# Patient Record
Sex: Male | Born: 1950 | Race: White | Hispanic: No | Marital: Married | Smoking: Former smoker
Health system: Southern US, Community
[De-identification: ages and names within clinical notes are randomized; demographics above are authoritative.]

## PROBLEM LIST (undated history)

## (undated) ENCOUNTER — Ambulatory Visit (HOSPITAL_BASED_OUTPATIENT_CLINIC_OR_DEPARTMENT_OTHER)

## (undated) ENCOUNTER — Ambulatory Visit (HOSPITAL_BASED_OUTPATIENT_CLINIC_OR_DEPARTMENT_OTHER): Source: Home / Self Care

## (undated) DIAGNOSIS — J984 Other disorders of lung: Secondary | ICD-10-CM

## (undated) DIAGNOSIS — M1990 Unspecified osteoarthritis, unspecified site: Secondary | ICD-10-CM

## (undated) DIAGNOSIS — K760 Fatty (change of) liver, not elsewhere classified: Secondary | ICD-10-CM

## (undated) DIAGNOSIS — R918 Other nonspecific abnormal finding of lung field: Secondary | ICD-10-CM

## (undated) DIAGNOSIS — J439 Emphysema, unspecified: Secondary | ICD-10-CM

## (undated) DIAGNOSIS — I288 Other diseases of pulmonary vessels: Secondary | ICD-10-CM

## (undated) DIAGNOSIS — E785 Hyperlipidemia, unspecified: Secondary | ICD-10-CM

## (undated) DIAGNOSIS — M25561 Pain in right knee: Secondary | ICD-10-CM

## (undated) DIAGNOSIS — M1711 Unilateral primary osteoarthritis, right knee: Secondary | ICD-10-CM

## (undated) DIAGNOSIS — I2584 Coronary atherosclerosis due to calcified coronary lesion: Secondary | ICD-10-CM

## (undated) DIAGNOSIS — E7800 Pure hypercholesterolemia, unspecified: Secondary | ICD-10-CM

## (undated) DIAGNOSIS — I2510 Atherosclerotic heart disease of native coronary artery without angina pectoris: Secondary | ICD-10-CM

## (undated) HISTORY — DX: Fatty (change of) liver, not elsewhere classified: K76.0

## (undated) HISTORY — PX: EYE SURGERY: SHX253

## (undated) HISTORY — DX: Emphysema, unspecified: J43.9

## (undated) HISTORY — DX: Pain in right knee: M25.561

## (undated) HISTORY — DX: Coronary atherosclerosis due to calcified coronary lesion: I25.84

## (undated) HISTORY — DX: Other nonspecific abnormal finding of lung field: R91.8

## (undated) HISTORY — DX: Atherosclerotic heart disease of native coronary artery without angina pectoris: I25.10

## (undated) HISTORY — PX: COLONOSCOPY: SHX174

## (undated) HISTORY — DX: Hyperlipidemia, unspecified: E78.5

## (undated) HISTORY — DX: Other disorders of lung: J98.4

## (undated) HISTORY — DX: Unilateral primary osteoarthritis, right knee: M17.11

## (undated) HISTORY — DX: Other diseases of pulmonary vessels: I28.8

---

## 2014-05-23 DIAGNOSIS — M25561 Pain in right knee: Secondary | ICD-10-CM | POA: Insufficient documentation

## 2014-05-23 DIAGNOSIS — M1711 Unilateral primary osteoarthritis, right knee: Secondary | ICD-10-CM | POA: Insufficient documentation

## 2014-05-23 HISTORY — DX: Unilateral primary osteoarthritis, right knee: M17.11

## 2014-05-23 HISTORY — DX: Pain in right knee: M25.561

## 2015-08-15 ENCOUNTER — Other Ambulatory Visit: Payer: Self-pay | Admitting: Orthopedic Surgery

## 2015-09-04 ENCOUNTER — Ambulatory Visit (HOSPITAL_COMMUNITY)
Admission: RE | Admit: 2015-09-04 | Discharge: 2015-09-04 | Disposition: A | Discharge status: Home / Self Care | Payer: 59 | Source: Ambulatory Visit | Attending: Orthopedic Surgery | Admitting: Orthopedic Surgery

## 2015-09-04 ENCOUNTER — Encounter (HOSPITAL_COMMUNITY): Payer: Self-pay

## 2015-09-04 ENCOUNTER — Encounter (HOSPITAL_COMMUNITY)
Admission: RE | Admit: 2015-09-04 | Discharge: 2015-09-04 | Disposition: A | Discharge status: Home / Self Care | Payer: 59 | Source: Ambulatory Visit | Attending: Orthopedic Surgery | Admitting: Orthopedic Surgery

## 2015-09-04 DIAGNOSIS — Z01812 Encounter for preprocedural laboratory examination: Secondary | ICD-10-CM | POA: Diagnosis not present

## 2015-09-04 DIAGNOSIS — R918 Other nonspecific abnormal finding of lung field: Secondary | ICD-10-CM | POA: Diagnosis not present

## 2015-09-04 DIAGNOSIS — Z01818 Encounter for other preprocedural examination: Secondary | ICD-10-CM

## 2015-09-04 HISTORY — DX: Pure hypercholesterolemia, unspecified: E78.00

## 2015-09-04 HISTORY — DX: Unspecified osteoarthritis, unspecified site: M19.90

## 2015-09-04 LAB — COMPREHENSIVE METABOLIC PANEL
ALBUMIN: 3.9 g/dL (ref 3.5–5.0)
ALT: 61 U/L (ref 17–63)
ANION GAP: 9 (ref 5–15)
AST: 75 U/L — ABNORMAL HIGH (ref 15–41)
Alkaline Phosphatase: 75 U/L (ref 38–126)
BILIRUBIN TOTAL: 0.9 mg/dL (ref 0.3–1.2)
BUN: 16 mg/dL (ref 6–20)
CO2: 24 mmol/L (ref 22–32)
Calcium: 9.6 mg/dL (ref 8.9–10.3)
Chloride: 106 mmol/L (ref 101–111)
Creatinine, Ser: 0.75 mg/dL (ref 0.61–1.24)
GFR calc Af Amer: >60 mL/min (ref >60)
GFR calc non Af Amer: >60 mL/min (ref >60)
GLUCOSE: 93 mg/dL (ref 65–99)
POTASSIUM: 4.4 mmol/L (ref 3.5–5.1)
SODIUM: 139 mmol/L (ref 135–145)
TOTAL PROTEIN: 7.3 g/dL (ref 6.5–8.1)

## 2015-09-04 LAB — CBC WITH DIFFERENTIAL/PLATELET
BASOS PCT: 1 %
Basophils Absolute: 0.1 10*3/uL (ref 0.0–0.1)
EOS ABS: 0.3 10*3/uL (ref 0.0–0.7)
Eosinophils Relative: 5 %
HCT: 41.8 % (ref 39.0–52.0)
Hemoglobin: 14.3 g/dL (ref 13.0–17.0)
Lymphocytes Relative: 41 %
Lymphs Abs: 2.1 10*3/uL (ref 0.7–4.0)
MCH: 33.9 pg (ref 26.0–34.0)
MCHC: 34.2 g/dL (ref 30.0–36.0)
MCV: 99.1 fL (ref 78.0–100.0)
MONO ABS: 0.6 10*3/uL (ref 0.1–1.0)
MONOS PCT: 12 %
Neutro Abs: 2.1 10*3/uL (ref 1.7–7.7)
Neutrophils Relative %: 41 %
Platelets: 228 10*3/uL (ref 150–400)
RBC: 4.22 MIL/uL (ref 4.22–5.81)
RDW: 12.7 % (ref 11.5–15.5)
WBC: 5.1 10*3/uL (ref 4.0–10.5)

## 2015-09-04 LAB — URINE MICROSCOPIC-ADD ON
Bacteria, UA: NONE SEEN
RBC / HPF: NONE SEEN RBC/hpf (ref 0–5)

## 2015-09-04 LAB — URINALYSIS, ROUTINE W REFLEX MICROSCOPIC
BILIRUBIN URINE: NEGATIVE
Glucose, UA: NEGATIVE mg/dL
Hgb urine dipstick: NEGATIVE
KETONES UR: NEGATIVE mg/dL
NITRITE: NEGATIVE
PROTEIN: NEGATIVE mg/dL
SPECIFIC GRAVITY, URINE: 1.022 (ref 1.005–1.030)
pH: 5.5 (ref 5.0–8.0)

## 2015-09-04 LAB — PROTIME-INR
INR: 0.97 (ref 0.00–1.49)
Prothrombin Time: 13.1 seconds (ref 11.6–15.2)

## 2015-09-04 LAB — APTT: aPTT: 28 seconds (ref 24–37)

## 2015-09-04 LAB — SURGICAL PCR SCREEN
MRSA, PCR: NEGATIVE
STAPHYLOCOCCUS AUREUS: NEGATIVE

## 2015-09-04 NOTE — Progress Notes (Signed)
   09/04/15 1007  OBSTRUCTIVE SLEEP APNEA  Have you ever been diagnosed with sleep apnea through a sleep study? No  Do you snore loudly (loud enough to be heard through closed doors)?  1  Do you often feel tired, fatigued, or sleepy during the daytime (such as falling asleep during driving or talking to someone)? 1 (MORE SO IN LAST YR....)  Has anyone observed you stop breathing during your sleep? 0  Do you have, or are you being treated for high blood pressure? 0  BMI more than 35 kg/m2? 0  Age > 50 (1-yes) 1  Neck circumference greater than:Male 16 inches or larger, Male 17inches or larger? 1  Male Gender (Yes=1) 1  Obstructive Sleep Apnea Score 5  Score 5 or greater  Results sent to PCP

## 2015-09-04 NOTE — Progress Notes (Signed)
Takes ibuprofen and was taking Lipitor. Was instructed by PCP to stop taking lipitor, may have elevated liver enzymes. Denies any heart issues.  Had stress test about 15 yrs ago just to get a baseline for physical. Normal results.  Had EKG, ? PFT at PCP back in Nov. 2016 as part of medical clearance for this surgery.

## 2015-09-04 NOTE — Pre-Procedure Instructions (Signed)
    Casey Webster  09/04/2015     No Pharmacies Listed   Your procedure is scheduled on Thursday, Dec. 22nd   Report to [facility] Admitting at 5:30 AM  Call this number if you have problems the morning of surgery:  [phone]   Remember:  Do not eat food or drink liquids after midnight Wednesday.   Take these medicines the morning of surgery with A SIP OF WATER :   Do not wear jewelry - no rings or watches.  Do not wear lotions or colognes.  You may NOT wear deodorant the day of surgery.             Men may shave face and neck.   Do not bring valuables to the hospital.  [facility] is not responsible for any belongings or valuables.  Contacts, dentures or bridgework may not be worn into surgery.  Leave your suitcase in the car.  After surgery it may be brought to your room. For patients admitted to the hospital, discharge time will be determined by your treatment team.  Name and phone number of your driver:   NANCY  Talmadge  - SPOUSE   Please read over the following fact sheets that you were given. Pain Booklet, Coughing and Deep Breathing, Blood Transfusion Information, MRSA Information and Surgical Site Infection Prevention

## 2015-09-05 LAB — URINE CULTURE

## 2015-09-13 MED ORDER — SODIUM CHLORIDE 0.9 % IV SOLN: INTRAVENOUS | Status: DC

## 2015-09-13 MED ORDER — CHLORHEXIDINE GLUCONATE 4 % EX LIQD: 60.0000 mL | Freq: Once | CUTANEOUS | Status: DC

## 2015-09-13 MED ORDER — DEXTROSE 5 % IV SOLN
3.0000 g | INTRAVENOUS | Status: AC
  Administered 2015-09-14: 3 g via INTRAVENOUS
  Filled 2015-09-13: qty 3000

## 2015-09-13 MED ORDER — TRANEXAMIC ACID 1000 MG/10ML IV SOLN
1000.0000 mg | INTRAVENOUS | Status: AC
  Administered 2015-09-14: 1000 mg via INTRAVENOUS
  Filled 2015-09-13 (×2): qty 10

## 2015-09-13 MED ORDER — BUPIVACAINE LIPOSOME 1.3 % IJ SUSP
20.0000 mL | Freq: Once | INTRAMUSCULAR | Status: AC
  Administered 2015-09-14: 20 mL
  Filled 2015-09-13 (×2): qty 20

## 2015-09-14 ENCOUNTER — Inpatient Hospital Stay (HOSPITAL_COMMUNITY): Payer: 59 | Admitting: Anesthesiology

## 2015-09-14 ENCOUNTER — Encounter (HOSPITAL_COMMUNITY)
Admission: RE | Disposition: A | Discharge status: Home Health | Payer: Self-pay | Source: Ambulatory Visit | Attending: Orthopedic Surgery

## 2015-09-14 ENCOUNTER — Inpatient Hospital Stay (HOSPITAL_COMMUNITY)
Admission: RE | Admit: 2015-09-14 | Discharge: 2015-09-15 | DRG: 470 | Disposition: A | Discharge status: Home Health | Payer: 59 | Source: Ambulatory Visit | Attending: Orthopedic Surgery | Admitting: Orthopedic Surgery

## 2015-09-14 ENCOUNTER — Encounter (HOSPITAL_COMMUNITY): Payer: Self-pay

## 2015-09-14 DIAGNOSIS — Z79899 Other long term (current) drug therapy: Secondary | ICD-10-CM | POA: Diagnosis not present

## 2015-09-14 DIAGNOSIS — M1711 Unilateral primary osteoarthritis, right knee: Secondary | ICD-10-CM | POA: Diagnosis present

## 2015-09-14 DIAGNOSIS — D62 Acute posthemorrhagic anemia: Secondary | ICD-10-CM | POA: Diagnosis not present

## 2015-09-14 DIAGNOSIS — Z96659 Presence of unspecified artificial knee joint: Secondary | ICD-10-CM

## 2015-09-14 DIAGNOSIS — Z87891 Personal history of nicotine dependence: Secondary | ICD-10-CM

## 2015-09-14 HISTORY — PX: TOTAL KNEE ARTHROPLASTY: SHX125

## 2015-09-14 LAB — CBC
HEMATOCRIT: 37.6 % — AB (ref 39.0–52.0)
HEMOGLOBIN: 13.1 g/dL (ref 13.0–17.0)
MCH: 34.2 pg — ABNORMAL HIGH (ref 26.0–34.0)
MCHC: 34.8 g/dL (ref 30.0–36.0)
MCV: 98.2 fL (ref 78.0–100.0)
Platelets: 196 10*3/uL (ref 150–400)
RBC: 3.83 MIL/uL — ABNORMAL LOW (ref 4.22–5.81)
RDW: 12.9 % (ref 11.5–15.5)
WBC: 9.5 10*3/uL (ref 4.0–10.5)

## 2015-09-14 LAB — CREATININE, SERUM: Creatinine, Ser: 0.79 mg/dL (ref 0.61–1.24)

## 2015-09-14 SURGERY — ARTHROPLASTY, KNEE, TOTAL
Anesthesia: Spinal | Site: Knee | Laterality: Right

## 2015-09-14 MED ORDER — ALUM & MAG HYDROXIDE-SIMETH 200-200-20 MG/5ML PO SUSP: 30.0000 mL | ORAL | Status: DC | PRN

## 2015-09-14 MED ORDER — FLEET ENEMA 7-19 GM/118ML RE ENEM: 1.0000 | ENEMA | Freq: Once | RECTAL | Status: DC | PRN

## 2015-09-14 MED ORDER — SODIUM CHLORIDE 0.9 % IJ SOLN
INTRAMUSCULAR | Status: DC | PRN
  Administered 2015-09-14: 10 mL

## 2015-09-14 MED ORDER — MEPERIDINE HCL 25 MG/ML IJ SOLN: 6.2500 mg | INTRAMUSCULAR | Status: DC | PRN

## 2015-09-14 MED ORDER — ZOLPIDEM TARTRATE 5 MG PO TABS: 5.0000 mg | ORAL_TABLET | Freq: Every evening | ORAL | Status: DC | PRN

## 2015-09-14 MED ORDER — METOCLOPRAMIDE HCL 5 MG/ML IJ SOLN: 5.0000 mg | Freq: Three times a day (TID) | INTRAMUSCULAR | Status: DC | PRN

## 2015-09-14 MED ORDER — METOCLOPRAMIDE HCL 5 MG PO TABS: 5.0000 mg | ORAL_TABLET | Freq: Three times a day (TID) | ORAL | Status: DC | PRN

## 2015-09-14 MED ORDER — ONDANSETRON HCL 4 MG/2ML IJ SOLN
INTRAMUSCULAR | Status: DC | PRN
  Administered 2015-09-14: 4 mg via INTRAVENOUS

## 2015-09-14 MED ORDER — ONDANSETRON HCL 4 MG PO TABS: 4.0000 mg | ORAL_TABLET | Freq: Four times a day (QID) | ORAL | Status: DC | PRN

## 2015-09-14 MED ORDER — SODIUM CHLORIDE 0.9 % IR SOLN
Status: DC | PRN
  Administered 2015-09-14: 1000 mL

## 2015-09-14 MED ORDER — SUCCINYLCHOLINE CHLORIDE 20 MG/ML IJ SOLN
INTRAMUSCULAR | Status: AC
  Filled 2015-09-14: qty 1

## 2015-09-14 MED ORDER — OXYCODONE HCL 5 MG PO TABS
ORAL_TABLET | ORAL | Status: AC
  Filled 2015-09-14: qty 2

## 2015-09-14 MED ORDER — PROPOFOL 500 MG/50ML IV EMUL
INTRAVENOUS | Status: DC | PRN
  Administered 2015-09-14: 50 ug/kg/min via INTRAVENOUS

## 2015-09-14 MED ORDER — FENTANYL CITRATE (PF) 250 MCG/5ML IJ SOLN
INTRAMUSCULAR | Status: AC
  Filled 2015-09-14: qty 5

## 2015-09-14 MED ORDER — ATORVASTATIN CALCIUM 20 MG PO TABS
20.0000 mg | ORAL_TABLET | Freq: Every day | ORAL | Status: DC
  Administered 2015-09-14 – 2015-09-15 (×2): 20 mg via ORAL
  Filled 2015-09-14 (×2): qty 1

## 2015-09-14 MED ORDER — ACETAMINOPHEN 650 MG RE SUPP: 650.0000 mg | Freq: Four times a day (QID) | RECTAL | Status: DC | PRN

## 2015-09-14 MED ORDER — HYDROMORPHONE HCL 1 MG/ML IJ SOLN
INTRAMUSCULAR | Status: AC
  Administered 2015-09-14: 0.5 mg via INTRAVENOUS
  Filled 2015-09-14: qty 1

## 2015-09-14 MED ORDER — BISACODYL 5 MG PO TBEC: 5.0000 mg | DELAYED_RELEASE_TABLET | Freq: Every day | ORAL | Status: DC | PRN

## 2015-09-14 MED ORDER — ONDANSETRON HCL 4 MG/2ML IJ SOLN: 4.0000 mg | Freq: Once | INTRAMUSCULAR | Status: DC | PRN

## 2015-09-14 MED ORDER — ENOXAPARIN SODIUM 30 MG/0.3ML ~~LOC~~ SOLN
30.0000 mg | Freq: Two times a day (BID) | SUBCUTANEOUS | Status: DC
Start: 2015-09-15 — End: 2015-09-15
  Administered 2015-09-15: 30 mg via SUBCUTANEOUS
  Filled 2015-09-14: qty 0.3

## 2015-09-14 MED ORDER — FLUTICASONE PROPIONATE 50 MCG/ACT NA SUSP: 1.0000 | Freq: Every day | NASAL | Status: DC | PRN

## 2015-09-14 MED ORDER — LACTATED RINGERS IV SOLN
INTRAVENOUS | Status: DC | PRN
  Administered 2015-09-14 (×2): via INTRAVENOUS

## 2015-09-14 MED ORDER — ONDANSETRON HCL 4 MG/2ML IJ SOLN
INTRAMUSCULAR | Status: AC
  Filled 2015-09-14: qty 2

## 2015-09-14 MED ORDER — CELECOXIB 200 MG PO CAPS
200.0000 mg | ORAL_CAPSULE | Freq: Two times a day (BID) | ORAL | Status: DC
  Administered 2015-09-14 – 2015-09-15 (×3): 200 mg via ORAL
  Filled 2015-09-14 (×3): qty 1

## 2015-09-14 MED ORDER — OXYCODONE HCL 5 MG PO TABS
5.0000 mg | ORAL_TABLET | ORAL | Status: DC | PRN
  Administered 2015-09-14 – 2015-09-15 (×5): 10 mg via ORAL
  Filled 2015-09-14 (×4): qty 2

## 2015-09-14 MED ORDER — BUPIVACAINE-EPINEPHRINE (PF) 0.5% -1:200000 IJ SOLN
INTRAMUSCULAR | Status: AC
  Filled 2015-09-14: qty 1.8

## 2015-09-14 MED ORDER — BUPIVACAINE-EPINEPHRINE (PF) 0.5% -1:200000 IJ SOLN
INTRAMUSCULAR | Status: AC
  Filled 2015-09-14: qty 30

## 2015-09-14 MED ORDER — PHENOL 1.4 % MT LIQD: 1.0000 | OROMUCOSAL | Status: DC | PRN

## 2015-09-14 MED ORDER — OXYCODONE HCL ER 10 MG PO T12A
10.0000 mg | EXTENDED_RELEASE_TABLET | Freq: Two times a day (BID) | ORAL | Status: DC
  Administered 2015-09-14 – 2015-09-15 (×3): 10 mg via ORAL
  Filled 2015-09-14 (×4): qty 1

## 2015-09-14 MED ORDER — HYDROMORPHONE HCL 1 MG/ML IJ SOLN
0.2500 mg | INTRAMUSCULAR | Status: DC | PRN
  Administered 2015-09-14 (×4): 0.5 mg via INTRAVENOUS

## 2015-09-14 MED ORDER — HYDROMORPHONE HCL 1 MG/ML IJ SOLN
1.0000 mg | INTRAMUSCULAR | Status: DC | PRN
  Administered 2015-09-14 – 2015-09-15 (×3): 1 mg via INTRAVENOUS
  Filled 2015-09-14 (×3): qty 1

## 2015-09-14 MED ORDER — FENTANYL CITRATE (PF) 100 MCG/2ML IJ SOLN
INTRAMUSCULAR | Status: DC | PRN
  Administered 2015-09-14: 50 ug via INTRAVENOUS
  Administered 2015-09-14: 25 ug via INTRAVENOUS

## 2015-09-14 MED ORDER — METHOCARBAMOL 1000 MG/10ML IJ SOLN
500.0000 mg | Freq: Four times a day (QID) | INTRAVENOUS | Status: DC | PRN
  Filled 2015-09-14: qty 5

## 2015-09-14 MED ORDER — ONDANSETRON HCL 4 MG/2ML IJ SOLN: 4.0000 mg | Freq: Four times a day (QID) | INTRAMUSCULAR | Status: DC | PRN

## 2015-09-14 MED ORDER — BUPIVACAINE-EPINEPHRINE (PF) 0.25% -1:200000 IJ SOLN
INTRAMUSCULAR | Status: DC | PRN
  Administered 2015-09-14: 30 mL

## 2015-09-14 MED ORDER — SODIUM CHLORIDE 0.9 % IV SOLN: INTRAVENOUS | Status: DC

## 2015-09-14 MED ORDER — TRANEXAMIC ACID 1000 MG/10ML IV SOLN: 1000.0000 mg | Freq: Once | INTRAVENOUS | Status: DC

## 2015-09-14 MED ORDER — DOCUSATE SODIUM 100 MG PO CAPS
100.0000 mg | ORAL_CAPSULE | Freq: Two times a day (BID) | ORAL | Status: DC
  Administered 2015-09-14 (×2): 100 mg via ORAL
  Filled 2015-09-14 (×3): qty 1

## 2015-09-14 MED ORDER — BUPIVACAINE-EPINEPHRINE (PF) 0.25% -1:200000 IJ SOLN
INTRAMUSCULAR | Status: AC
  Filled 2015-09-14: qty 30

## 2015-09-14 MED ORDER — DIPHENHYDRAMINE HCL 12.5 MG/5ML PO ELIX: 12.5000 mg | ORAL_SOLUTION | ORAL | Status: DC | PRN

## 2015-09-14 MED ORDER — MENTHOL 3 MG MT LOZG: 1.0000 | LOZENGE | OROMUCOSAL | Status: DC | PRN

## 2015-09-14 MED ORDER — MIDAZOLAM HCL 2 MG/2ML IJ SOLN
INTRAMUSCULAR | Status: AC
  Filled 2015-09-14: qty 2

## 2015-09-14 MED ORDER — CEFAZOLIN SODIUM-DEXTROSE 2-3 GM-% IV SOLR
2.0000 g | Freq: Four times a day (QID) | INTRAVENOUS | Status: AC
  Administered 2015-09-14 (×2): 2 g via INTRAVENOUS
  Filled 2015-09-14 (×2): qty 50

## 2015-09-14 MED ORDER — PROPOFOL 10 MG/ML IV BOLUS
INTRAVENOUS | Status: AC
  Filled 2015-09-14: qty 20

## 2015-09-14 MED ORDER — METHOCARBAMOL 500 MG PO TABS
ORAL_TABLET | ORAL | Status: AC
  Filled 2015-09-14: qty 1

## 2015-09-14 MED ORDER — METHOCARBAMOL 500 MG PO TABS
500.0000 mg | ORAL_TABLET | Freq: Four times a day (QID) | ORAL | Status: DC | PRN
  Administered 2015-09-14 – 2015-09-15 (×3): 500 mg via ORAL
  Filled 2015-09-14 (×2): qty 1

## 2015-09-14 MED ORDER — ACETAMINOPHEN 325 MG PO TABS: 650.0000 mg | ORAL_TABLET | Freq: Four times a day (QID) | ORAL | Status: DC | PRN

## 2015-09-14 MED ORDER — MIDAZOLAM HCL 5 MG/5ML IJ SOLN
INTRAMUSCULAR | Status: DC | PRN
  Administered 2015-09-14: 2 mg via INTRAVENOUS

## 2015-09-14 MED ORDER — SENNOSIDES-DOCUSATE SODIUM 8.6-50 MG PO TABS: 1.0000 | ORAL_TABLET | Freq: Every evening | ORAL | Status: DC | PRN

## 2015-09-14 SURGICAL SUPPLY — 59 items
BANDAGE ESMARK 6X9 LF (GAUZE/BANDAGES/DRESSINGS) ×1 IMPLANT
BLADE SAGITTAL 13X1.27X60 (BLADE) ×2 IMPLANT
BLADE SAW SGTL 83.5X18.5 (BLADE) ×4 IMPLANT
BLADE SURG 10 STRL SS (BLADE) ×4 IMPLANT
BNDG ESMARK 6X9 LF (GAUZE/BANDAGES/DRESSINGS) ×2
BOWL SMART MIX CTS (DISPOSABLE) ×2 IMPLANT
CAPT KNEE TOTAL 3 ×2 IMPLANT
CEMENT BONE SIMPLEX SPEEDSET (Cement) ×4 IMPLANT
COVER SURGICAL LIGHT HANDLE (MISCELLANEOUS) ×2 IMPLANT
CUFF TOURNIQUET SINGLE 34IN LL (TOURNIQUET CUFF) ×2 IMPLANT
DRAPE EXTREMITY T 121X128X90 (DRAPE) ×2 IMPLANT
DRAPE INCISE IOBAN 66X45 STRL (DRAPES) ×4 IMPLANT
DRAPE PROXIMA HALF (DRAPES) IMPLANT
DRAPE U-SHAPE 47X51 STRL (DRAPES) ×2 IMPLANT
DRSG ADAPTIC 3X8 NADH LF (GAUZE/BANDAGES/DRESSINGS) ×2 IMPLANT
DRSG PAD ABDOMINAL 8X10 ST (GAUZE/BANDAGES/DRESSINGS) ×2 IMPLANT
DURAPREP 26ML APPLICATOR (WOUND CARE) ×4 IMPLANT
ELECT REM PT RETURN 9FT ADLT (ELECTROSURGICAL) ×2
ELECTRODE REM PT RTRN 9FT ADLT (ELECTROSURGICAL) ×1 IMPLANT
GAUZE SPONGE 4X4 12PLY STRL (GAUZE/BANDAGES/DRESSINGS) ×2 IMPLANT
GLOVE BIOGEL M 7.0 STRL (GLOVE) IMPLANT
GLOVE BIOGEL PI IND STRL 7.0 (GLOVE) ×1 IMPLANT
GLOVE BIOGEL PI IND STRL 7.5 (GLOVE) IMPLANT
GLOVE BIOGEL PI IND STRL 8.5 (GLOVE) ×2 IMPLANT
GLOVE BIOGEL PI INDICATOR 7.0 (GLOVE) ×1
GLOVE BIOGEL PI INDICATOR 7.5 (GLOVE)
GLOVE BIOGEL PI INDICATOR 8.5 (GLOVE) ×2
GLOVE SURG ORTHO 8.0 STRL STRW (GLOVE) ×4 IMPLANT
GLOVE SURG SS PI 7.0 STRL IVOR (GLOVE) ×2 IMPLANT
GOWN STRL REUS W/ TWL LRG LVL3 (GOWN DISPOSABLE) ×1 IMPLANT
GOWN STRL REUS W/ TWL XL LVL3 (GOWN DISPOSABLE) ×2 IMPLANT
GOWN STRL REUS W/TWL LRG LVL3 (GOWN DISPOSABLE) ×1
GOWN STRL REUS W/TWL XL LVL3 (GOWN DISPOSABLE) ×2
HANDPIECE INTERPULSE COAX TIP (DISPOSABLE) ×1
HOOD PEEL AWAY FACE SHEILD DIS (HOOD) ×6 IMPLANT
KIT BASIN OR (CUSTOM PROCEDURE TRAY) ×2 IMPLANT
KIT ROOM TURNOVER OR (KITS) ×2 IMPLANT
KNEE CAPITATED TOTAL 3 ×1 IMPLANT
MANIFOLD NEPTUNE II (INSTRUMENTS) ×2 IMPLANT
NEEDLE 22X1 1/2 (OR ONLY) (NEEDLE) ×4 IMPLANT
NS IRRIG 1000ML POUR BTL (IV SOLUTION) ×2 IMPLANT
PACK TOTAL JOINT (CUSTOM PROCEDURE TRAY) ×2 IMPLANT
PACK UNIVERSAL I (CUSTOM PROCEDURE TRAY) ×2 IMPLANT
PAD ARMBOARD 7.5X6 YLW CONV (MISCELLANEOUS) ×4 IMPLANT
PADDING CAST COTTON 6X4 STRL (CAST SUPPLIES) ×2 IMPLANT
SET HNDPC FAN SPRY TIP SCT (DISPOSABLE) ×1 IMPLANT
SPONGE GAUZE 4X4 12PLY STER LF (GAUZE/BANDAGES/DRESSINGS) ×2 IMPLANT
STAPLER VISISTAT 35W (STAPLE) IMPLANT
SUCTION FRAZIER TIP 10 FR DISP (SUCTIONS) ×2 IMPLANT
SUT BONE WAX W31G (SUTURE) ×2 IMPLANT
SUT VIC AB 0 CTB1 27 (SUTURE) ×4 IMPLANT
SUT VIC AB 1 CT1 27 (SUTURE) ×3
SUT VIC AB 1 CT1 27XBRD ANBCTR (SUTURE) ×3 IMPLANT
SUT VIC AB 2-0 CT1 27 (SUTURE) ×2
SUT VIC AB 2-0 CT1 TAPERPNT 27 (SUTURE) ×2 IMPLANT
SYR 20CC LL (SYRINGE) ×4 IMPLANT
TOWEL OR 17X24 6PK STRL BLUE (TOWEL DISPOSABLE) ×2 IMPLANT
TOWEL OR 17X26 10 PK STRL BLUE (TOWEL DISPOSABLE) IMPLANT
WATER STERILE IRR 1000ML POUR (IV SOLUTION) ×4 IMPLANT

## 2015-09-14 NOTE — Anesthesia Procedure Notes (Signed)
Spinal Patient location during procedure: OR Start time: 09/14/2015 7:30 AM End time: 09/14/2015 7:34 AM Staffing Anesthesiologist: Lillia Abed Performed by: anesthesiologist  Preanesthetic Checklist Completed: patient identified, site marked, surgical consent, pre-op evaluation, timeout performed, IV checked, risks and benefits discussed and monitors and equipment checked Spinal Block Patient position: sitting Prep: Betadine Patient monitoring: heart rate, cardiac monitor, continuous pulse ox and blood pressure Approach: right paramedian Location: L3-4 Injection technique: single-shot Needle Needle type: Pencan  Needle gauge: 24 G Needle length: 9 cm Needle insertion depth: 8 cm

## 2015-09-14 NOTE — Progress Notes (Signed)
Report given to Jamie RN

## 2015-09-14 NOTE — Evaluation (Signed)
Physical Therapy Evaluation Patient Details Name: Casey Webster MRN: [id number] DOB: [birth year] Today's Date: 09/14/2015   History of Present Illness  elective TKR  Clinical Impression  Pt is s/p TKA resulting in the deficits listed below (see PT Problem List).  Pt will benefit from continued PT to increase their independence and safety with mobility to allow discharge to home.      Follow Up Recommendations Home health PT;Supervision - Intermittent    Equipment Recommendations  None recommended by PT    Recommendations for Other Services       Precautions / Restrictions Precautions Precautions: Knee Restrictions Weight Bearing Restrictions: Yes RUE Weight Bearing: Weight bearing as tolerated      Mobility  Bed Mobility Overal bed mobility: Needs Assistance Bed Mobility: Supine to Sit     Supine to sit: Min assist     General bed mobility comments: assistance to guide RLE and education on sequencing  Transfers Overall transfer level: Needs assistance Equipment used: Rolling walker (2 wheeled) Transfers: Sit to/from Stand Sit to Stand: Min assist         General transfer comment: education on sequencing  Ambulation/Gait Ambulation/Gait assistance: Min assist Ambulation Distance (Feet): 10 Feet Assistive device: Rolling walker (2 wheeled) Gait Pattern/deviations: Step-to pattern;Decreased stance time - right;Antalgic Gait velocity: decreased Gait velocity interpretation: Below normal speed for age/gender General Gait Details: min assist for balance, education on sequencing, proper technique  Stairs            Wheelchair Mobility    Modified Rankin (Stroke Patients Only)       Balance Overall balance assessment: No apparent balance deficits (not formally assessed)                                           Pertinent Vitals/Pain Pain Assessment: 0-10 Pain Score: 4  Pain Descriptors / Indicators: Aching Pain Intervention(s):  Limited activity within patient's tolerance;Monitored during session;Premedicated before session    Home Living Family/patient expects to be discharged to:: Private residence Living Arrangements: Spouse/significant other Available Help at Discharge: Family;Available 24 hours/day Type of Home: House Home Access: Stairs to enter Entrance Stairs-Rails: None Entrance Stairs-Number of Steps: 1 Home Layout: Two level;Able to live on main level with bedroom/bathroom (created room on main level temporarily) Home Equipment: Walker - 2 wheels;Crutches      Prior Function Level of Independence: Independent               Hand Dominance        Extremity/Trunk Assessment   Upper Extremity Assessment: Overall WFL for tasks assessed           Lower Extremity Assessment: RLE deficits/detail         Communication   Communication: No difficulties  Cognition Arousal/Alertness: Awake/alert Behavior During Therapy: WFL for tasks assessed/performed Overall Cognitive Status: Within Functional Limits for tasks assessed                      General Comments      Exercises Total Joint Exercises Ankle Circles/Pumps: AROM;Both;10 reps;Seated Quad Sets: AROM;Strengthening;Both;10 reps;Seated Gluteal Sets: AROM;Both;10 reps;Seated      Assessment/Plan    PT Assessment Patient needs continued PT services  PT Diagnosis Difficulty walking   PT Problem List Decreased range of motion;Decreased activity tolerance;Decreased mobility;Decreased knowledge of use of DME;Decreased knowledge of precautions;Pain  PT Treatment Interventions  DME instruction;Gait training;Stair training;Functional mobility training;Therapeutic activities;Therapeutic exercise;Patient/family education   PT Goals (Current goals can be found in the Care Plan section) Acute Rehab PT Goals Patient Stated Goal: "catch my wife"  PT Goal Formulation: With patient/family Time For Goal Achievement:  09/21/15 Potential to Achieve Goals: Good    Frequency 7X/week   Barriers to discharge        Co-evaluation               End of Session Equipment Utilized During Treatment: Gait belt Activity Tolerance: Patient tolerated treatment well;No increased pain Patient left: in chair;with family/visitor present;with call bell/phone within reach           Time: 1459-1524 PT Time Calculation (min) (ACUTE ONLY): 25 min   Charges:   PT Evaluation $Initial PT Evaluation Tier I: 1 Procedure PT Treatments $Therapeutic Exercise: 8-22 mins   PT G Codes:        Shanna Cisco 09/14/2015, 3:34 PM  09/14/2015 [place], [place]

## 2015-09-14 NOTE — Op Note (Signed)
TOTAL KNEE REPLACEMENT OPERATIVE NOTE:  09/14/2015  9:35 AM  PATIENT:  Casey Webster  64 y.o. male  PRE-OPERATIVE DIAGNOSIS:  primary osteoarthritis right knee  POST-OPERATIVE DIAGNOSIS:  primary osteoarthritis right knee  PROCEDURE:  Procedure(s): TOTAL KNEE ARTHROPLASTY  SURGEON:  Surgeon(s): Vickey Huger, MD  PHYSICIAN ASSISTANT: Carlynn Spry, [facility]  ANESTHESIA:   spinal  DRAINS: Hemovac  SPECIMEN: None  COUNTS:  Correct  TOURNIQUET:  * Missing tourniquet times found for documented tourniquets in log:  [id number] *  DICTATION:  Indication for procedure:    The patient is a 64 y.o. male who has failed conservative treatment for primary osteoarthritis right knee.  Informed consent was obtained prior to anesthesia. The risks versus benefits of the operation were explain and in a way the patient can, and did, understand.   On the implant demand matching protocol, this patient scored 10.  Therefore, this patient did" "did not receive a polyethylene insert with vitamin E which is a high demand implant.  Description of procedure:     The patient was taken to the operating room and placed under anesthesia.  The patient was positioned in the usual fashion taking care that all body parts were adequately padded and/or protected.  I foley catheter was not placed.  A tourniquet was applied and the leg prepped and draped in the usual sterile fashion.  The extremity was exsanguinated with the esmarch and tourniquet inflated to 350 mmHg.  Pre-operative range of motion was normal.  The knee was in 5 degree of mild valgus.  A midline incision approximately 6-7 inches long was made with a #10 blade.  A new blade was used to make a parapatellar arthrotomy going 2-3 cm into the quadriceps tendon, over the patella, and alongside the medial aspect of the patellar tendon.  A synovectomy was then performed with the #10 blade and forceps. I then elevated the deep MCL off the medial tibial metaphysis  subperiosteally around to the semimembranosus attachment.    I everted the patella and used calipers to measure patellar thickness.  I used the reamer to ream down to appropriate thickness to recreate the native thickness.  I then removed excess bone with the rongeur and sagittal saw.  I used the appropriately sized template and drilled the three lug holes.  I then put the trial in place and measured the thickness with the calipers to ensure recreation of the native thickness.  The trial was then removed and the patella subluxed and the knee brought into flexion.  A homan retractor was place to retract and protect the patella and lateral structures.  A Z-retractor was place medially to protect the medial structures.  The extra-medullary alignment system was used to make cut the tibial articular surface perpendicular to the anamotic axis of the tibia and in 3 degrees of posterior slope.  The cut surface and alignment jig was removed.  I then used the intramedullary alignment guide to make a 4 valgus cut on the distal femur.  I then marked out the epicondylar axis on the distal femur.  The posterior condylar axis measured 5 degrees.  I then used the anterior referencing sizer and measured the femur to be a size 10.  The 4-In-1 cutting block was screwed into place in external rotation matching the posterior condylar angle, making our cuts perpendicular to the epicondylar axis.  Anterior, posterior and chamfer cuts were made with the sagittal saw.  The cutting block and cut pieces were removed.  A lamina  spreader was placed in 90 degrees of flexion.  The ACL, PCL, menisci, and posterior condylar osteophytes were removed.  A 10 mm spacer blocked was found to offer good flexion and extension gap balance after moderate in degree releasing.   The scoop retractor was then placed and the femoral finishing block was pinned in place.  The small sagittal saw was used as well as the lug drill to finish the femur.  The  block and cut surfaces were removed and the medullary canal hole filled with autograft bone from the cut pieces.  The tibia was delivered forward in deep flexion and external rotation.  A size G tray was selected and pinned into place centered on the medial 1/3 of the tibial tubercle.  The reamer and keel was used to prepare the tibia through the tray.    I then trialed with the size 10 femur, size G tibia, a 10 mm insert and the 35 patella.  I had excellent flexion/extension gap balance, excellent patella tracking.  Flexion was full and beyond 120 degrees; extension was zero.  These components were chosen and the staff opened them to me on the back table while the knee was lavaged copiously and the cement mixed.  The soft tissue was infiltrated with 60cc of exparel 1.3% through a 21 gauge needle.  I cemented in the components and removed all excess cement.  The polyethylene tibial component was snapped into place and the knee placed in extension while cement was hardening.  The capsule was infilltrated with 30cc of .25% Marcaine with epinephrine.  A hemovac was place in the joint exiting superolaterally.  A pain pump was place superomedially superficial to the arthrotomy.  Once the cement was hard, the tourniquet was let down.  Hemostasis was obtained.  The arthrotomy was closed with figure-8 #1 vicryl sutures.  The deep soft tissues were closed with #0 vicryls and the subcuticular layer closed with a running #2-0 vicryl.  The skin was reapproximated and closed with skin staples.  The wound was dressed with xeroform, 4 x4's, 2 ABD sponges, a single layer of webril and a TED stocking.   The patient was then awakened, extubated, and taken to the recovery room in stable condition.  BLOOD LOSS:  300cc DRAINS: 1 hemovac, 1 pain catheter COMPLICATIONS:  None.  PLAN OF CARE: Admit to inpatient   PATIENT DISPOSITION:  PACU - hemodynamically stable.   Delay start of Pharmacological VTE agent (>24hrs) due  to surgical blood loss or risk of bleeding:  not applicable  Please fax a copy of this op note to my office at [phone] (please only include page 1 and 2 of the Case Information op note)

## 2015-09-14 NOTE — Anesthesia Postprocedure Evaluation (Signed)
Anesthesia Post Note  Patient: Casey Webster  Procedure(s) Performed: Procedure(s) (LRB): TOTAL KNEE ARTHROPLASTY (Right)  Patient location during evaluation: PACU Anesthesia Type: General and Spinal Level of consciousness: awake and alert Pain management: pain level controlled Vital Signs Assessment: post-procedure vital signs reviewed and stable Respiratory status: spontaneous breathing, nonlabored ventilation, respiratory function stable and patient connected to nasal cannula oxygen Cardiovascular status: blood pressure returned to baseline and stable Postop Assessment: no signs of nausea or vomiting Anesthetic complications: no    Last Vitals:  Filed Vitals:   09/14/15 1200 09/14/15 1238  BP:  135/75  Pulse: 52   Temp:  36.6 C  Resp: 17 16    Last Pain:  Filed Vitals:   09/14/15 1418  PainSc: 7                  Josephus Harriger DAVID

## 2015-09-14 NOTE — Progress Notes (Signed)
Orthopedic Tech Progress Note Patient Details:  Casey Webster 04-06-1951 [id number]  CPM Right Knee CPM Right Knee: On Right Knee Flexion (Degrees): 90 Right Knee Extension (Degrees): 0 Additional Comments: Trapeze bar and foot roll   Casey Webster 09/14/2015, 10:35 AM

## 2015-09-14 NOTE — Transfer of Care (Signed)
Immediate Anesthesia Transfer of Care Note  Patient: Casey Webster  Procedure(s) Performed: Procedure(s): TOTAL KNEE ARTHROPLASTY (Right)  Patient Location: PACU  Anesthesia Type:Spinal  Level of Consciousness: awake, alert , oriented, patient cooperative and responds to stimulation  Airway & Oxygen Therapy: Patient Spontanous Breathing  Post-op Assessment: Report given to RN and Post -op Vital signs reviewed and stable  Post vital signs: Reviewed and stable  Last Vitals:  Filed Vitals:   09/14/15 0601  BP: 104/56  Pulse: 58  Temp: [id number] C    Complications: No apparent anesthesia complications

## 2015-09-14 NOTE — Progress Notes (Signed)
Utilization review completed.  

## 2015-09-14 NOTE — Anesthesia Preprocedure Evaluation (Signed)
Anesthesia Evaluation  Patient identified by MRN, date of birth, ID band Patient awake    Reviewed: Allergy & Precautions, NPO status , Patient's Chart, lab work & pertinent test results  Airway Mallampati: I  TM Distance: >3 FB Neck ROM: Full    Dental   Pulmonary former smoker,    Pulmonary exam normal        Cardiovascular Normal cardiovascular exam     Neuro/Psych    GI/Hepatic   Endo/Other    Renal/GU      Musculoskeletal   Abdominal   Peds  Hematology   Anesthesia Other Findings   Reproductive/Obstetrics                             Anesthesia Physical Anesthesia Plan  ASA: II  Anesthesia Plan: Spinal   Post-op Pain Management:    Induction: Intravenous  Airway Management Planned: Simple Face Mask  Additional Equipment:   Intra-op Plan:   Post-operative Plan:   Informed Consent: I have reviewed the patients History and Physical, chart, labs and discussed the procedure including the risks, benefits and alternatives for the proposed anesthesia with the patient or authorized representative who has indicated his/her understanding and acceptance.     Plan Discussed with: CRNA and Surgeon  Anesthesia Plan Comments:         Anesthesia Quick Evaluation

## 2015-09-14 NOTE — H&P (Signed)
Casey Webster MRN:  [id number] DOB/SEX:  12-14-1950/male  CHIEF COMPLAINT:  Painful right Knee  HISTORY: Patient is a 64 y.o. male presented with a history of pain in the right knee. Onset of symptoms was gradual starting several years ago with gradually worsening course since that time. Prior procedures on the knee include arthroscopy. Patient has been treated conservatively with over-the-counter NSAIDs and activity modification. Patient currently rates pain in the knee at 9 out of 10 with activity. There is pain at night.  PAST MEDICAL HISTORY: There are no active problems to display for this patient.  Past Medical History  Diagnosis Date  . High cholesterol   . Arthritis    Past Surgical History  Procedure Laterality Date  . Colonoscopy    . Eye surgery      BILATERAL CATARACTS     MEDICATIONS:   Prescriptions prior to admission  Medication Sig Dispense Refill Last Dose  . atorvastatin (LIPITOR) 20 MG tablet Take 20 mg by mouth daily.   Past Month at Unknown time  . calcium carbonate (OS-CAL - DOSED IN MG OF ELEMENTAL CALCIUM) 1250 (500 CA) MG tablet Take 1 tablet by mouth daily with breakfast.   Past Month at Unknown time  . co-enzyme Q-10 30 MG capsule Take 30 mg by mouth 3 (three) times daily.   Past Month at Unknown time  . fluticasone (FLONASE) 50 MCG/ACT nasal spray Place 1 spray into both nostrils daily as needed for allergies.    Past Month at Unknown time  . ibuprofen (ADVIL,MOTRIN) 400 MG tablet Take 400 mg by mouth daily as needed for mild pain.   Past Month at Unknown time  . Multiple Vitamin (MULTIVITAMIN WITH MINERALS) TABS tablet Take 1 tablet by mouth daily.   Past Month at Unknown time  . Omega-3 Fatty Acids (FISH OIL) 1000 MG CAPS Take 1,000 mg by mouth daily.   Past Month at Unknown time  . vitamin B-12 (CYANOCOBALAMIN) 1000 MCG tablet Take 1,000 mcg by mouth daily.   Past Month at Unknown time    ALLERGIES:  No Known Allergies  REVIEW OF SYSTEMS:   Pertinent items noted in HPI and remainder of comprehensive ROS otherwise negative.   FAMILY HISTORY:  History reviewed. No pertinent family history.  SOCIAL HISTORY:   Social History  Substance Use Topics  . Smoking status: Former Smoker -- 1.00 packs/day for 40 years    Types: Cigarettes    Quit date: 10/05/2011  . Smokeless tobacco: Not on file  . Alcohol Use: 12.6 oz/week    21 Shots of liquor per week     Comment: VODKA     EXAMINATION:  Vital signs in last 24 hours: Temp:  [97.6 F (36.4 C)] 97.6 F (36.4 C) (12/22 0601) Pulse Rate:  [58] 58 (12/22 0601) BP: (104)/(56) 104/56 mmHg (12/22 0601) SpO2:  [96 %] 96 % (12/22 0601)  General appearance: alert, cooperative and no distress Lungs: clear to auscultation bilaterally Heart: regular rate and rhythm, S1, S2 normal, no murmur, click, rub or gallop Abdomen: soft, non-tender; bowel sounds normal; no masses,  no organomegaly Extremities: extremities normal, atraumatic, no cyanosis or edema and Homans sign is negative, no sign of DVT Pulses: 2+ and symmetric Skin: Skin color, texture, turgor normal. No rashes or lesions Neurologic: Alert and oriented X 3, normal strength and tone. Normal symmetric reflexes. Normal coordination and gait  Musculoskeletal:  ROM 0-100, Ligaments intact,  Imaging Review Plain radiographs demonstrate severe degenerative joint disease of  the right knee. The overall alignment is significant varus. The bone quality appears to be excellent for age and reported activity level.  Assessment/Plan: Primary osteoarthritis, right knee   The patient history, physical examination and imaging studies are consistent with advanced degenerative joint disease of the right knee. The patient has failed conservative treatment.  The clearance notes were reviewed.  After discussion with the patient it was felt that Total Knee Replacement was indicated. The procedure,  risks, and benefits of total knee arthroplasty  were presented and reviewed. The risks including but not limited to aseptic loosening, infection, blood clots, vascular injury, stiffness, patella tracking problems complications among others were discussed. The patient acknowledged the explanation, agreed to proceed with the plan.  Casey Webster 09/14/2015, 6:45 AM

## 2015-09-15 ENCOUNTER — Encounter (HOSPITAL_COMMUNITY): Payer: Self-pay | Admitting: Orthopedic Surgery

## 2015-09-15 LAB — CBC
HEMATOCRIT: 36.1 % — AB (ref 39.0–52.0)
HEMOGLOBIN: 12.3 g/dL — AB (ref 13.0–17.0)
MCH: 33.6 pg (ref 26.0–34.0)
MCHC: 34.1 g/dL (ref 30.0–36.0)
MCV: 98.6 fL (ref 78.0–100.0)
Platelets: 205 10*3/uL (ref 150–400)
RBC: 3.66 MIL/uL — ABNORMAL LOW (ref 4.22–5.81)
RDW: 12.7 % (ref 11.5–15.5)
WBC: 9.3 10*3/uL (ref 4.0–10.5)

## 2015-09-15 LAB — BASIC METABOLIC PANEL
ANION GAP: 8 (ref 5–15)
BUN: 15 mg/dL (ref 6–20)
CALCIUM: 8.6 mg/dL — AB (ref 8.9–10.3)
CO2: 24 mmol/L (ref 22–32)
Chloride: 100 mmol/L — ABNORMAL LOW (ref 101–111)
Creatinine, Ser: 0.82 mg/dL (ref 0.61–1.24)
GFR calc Af Amer: >60 mL/min (ref >60)
Glucose, Bld: 123 mg/dL — ABNORMAL HIGH (ref 65–99)
POTASSIUM: 3.8 mmol/L (ref 3.5–5.1)
SODIUM: 132 mmol/L — AB (ref 135–145)

## 2015-09-15 MED ORDER — ENOXAPARIN SODIUM 40 MG/0.4ML ~~LOC~~ SOLN: 40.0000 mg | SUBCUTANEOUS | Status: DC

## 2015-09-15 MED ORDER — METHOCARBAMOL 500 MG PO TABS: 500.0000 mg | ORAL_TABLET | Freq: Four times a day (QID) | ORAL | Status: DC | PRN

## 2015-09-15 MED ORDER — OXYCODONE HCL 5 MG PO TABS: 5.0000 mg | ORAL_TABLET | ORAL | Status: DC | PRN

## 2015-09-15 MED ORDER — OXYCODONE HCL ER 10 MG PO T12A: 10.0000 mg | EXTENDED_RELEASE_TABLET | Freq: Two times a day (BID) | ORAL | Status: DC

## 2015-09-15 NOTE — Evaluation (Signed)
Occupational Therapy Evaluation Patient Details Name: Casey Webster MRN: [id number] DOB: [birth year] Today's Date: 09/15/2015    History of Present Illness elective TKR   Clinical Impression   Pt reports he was independent with ADLs PTA. Currently pt is overall supervision for safety with ADLs and functional mobility with the exception of min assist for LB ADLs. All education complete; pt and wife with no further questions or concerns for OT at this time. Pt planning to d/c home with 24/7 supervision from his wife. Pt ready to d/c from an OT standpoint; signing off at this time. Thank you for this referral.     Follow Up Recommendations  No OT follow up;Supervision - Intermittent    Equipment Recommendations  None recommended by OT    Recommendations for Other Services       Precautions / Restrictions Precautions Precautions: Knee Precaution Comments: Reviewed precautions, no pillow under knee, and use of zero degree Restrictions Weight Bearing Restrictions: Yes RUE Weight Bearing: Weight bearing as tolerated      Mobility Bed Mobility Overal bed mobility: Needs Assistance Bed Mobility: Supine to Sit     Supine to sit: Min assist     General bed mobility comments: Pt OOB in chair  Transfers Overall transfer level: Needs assistance Equipment used: Rolling walker (2 wheeled) Transfers: Sit to/from Stand Sit to Stand: Supervision         General transfer comment: Good hand placement and technique. Supervision for safety.    Balance Overall balance assessment: No apparent balance deficits (not formally assessed)                                          ADL Overall ADL's : Needs assistance/impaired Eating/Feeding: Set up;Sitting   Grooming: Supervision/safety;Standing       Lower Body Bathing: Minimal assistance;Sit to/from stand       Lower Body Dressing: Minimal assistance;Sit to/from stand Lower Body Dressing Details (indicate cue  type and reason): Pt able to don/doff L sock, difficulty reaching R foot. Pt reports wife can assist with ADLs as needed. Educated on compensatory strategies for LB ADLs, sitting when performing LB dressing; pt verbalized understanding. Toilet Transfer: Supervision/safety;Ambulation;BSC;RW (BSC over toilet) Toilet Transfer Details (indicate cue type and reason): Educated on use of 3 in 1 over toilet Toileting- Clothing Manipulation and Hygiene: Supervision/safety;Sit to/from stand   Tub/ Shower Transfer: Supervision/safety;Ambulation;Shower Technical sales engineer Details (indicate cue type and reason): Educated on walk in shower transfer technique; pt able to return demo. Wife present and verbalized understanding of how she can assist during walk in shower transfer. Functional mobility during ADLs: Supervision/safety;Rolling walker General ADL Comments: Pts wife present for OT eval. Educated on home safety, supervision for safety during ADLs and mobility, ice for edema and pain; pt and wife verbalize understanding.     Vision     Perception     Praxis      Pertinent Vitals/Pain Pain Assessment: Faces Faces Pain Scale: Hurts little more Pain Location: R knee Pain Descriptors / Indicators: Aching;Sore Pain Intervention(s): Limited activity within patient's tolerance;Monitored during session;Repositioned;Ice applied     Hand Dominance     Extremity/Trunk Assessment Upper Extremity Assessment Upper Extremity Assessment: Overall WFL for tasks assessed   Lower Extremity Assessment Lower Extremity Assessment: Defer to PT evaluation   Cervical / Trunk Assessment Cervical / Trunk Assessment: Normal   Communication Communication  Communication: No difficulties   Cognition Arousal/Alertness: Awake/alert Behavior During Therapy: WFL for tasks assessed/performed Overall Cognitive Status: Within Functional Limits for tasks assessed                     General  Comments       Exercises       Shoulder Instructions      Home Living Family/patient expects to be discharged to:: Private residence Living Arrangements: Spouse/significant other Available Help at Discharge: Family;Available 24 hours/day Type of Home: House Home Access: Stairs to enter [employer] of Steps: 1 Entrance Stairs-Rails: None Home Layout: Two level;Able to live on main level with bedroom/bathroom (set up room on mail level temporarily) Alternate Level Stairs-Number of Steps: flight Alternate Level Stairs-Rails: Right Bathroom Shower/Tub: Walk-in shower   Bathroom Toilet: Handicapped height Bathroom Accessibility: Yes How Accessible: Accessible via walker Home Equipment: Walker - 2 wheels;Crutches;Bedside commode;Shower seat          Prior Functioning/Environment Level of Independence: Independent             OT Diagnosis: Acute pain   OT Problem List:     OT Treatment/Interventions:      OT Goals(Current goals can be found in the care plan section) Acute Rehab OT Goals Patient Stated Goal: go home today OT Goal Formulation: All assessment and education complete, DC therapy  OT Frequency:     Barriers to D/C:            Co-evaluation              End of Session Equipment Utilized During Treatment: Rolling walker CPM Right Knee CPM Right Knee: Off  Activity Tolerance: Patient tolerated treatment well Patient left: in chair;with call bell/phone within reach;with family/visitor present   Time: 1147-1210 OT Time Calculation (min): 23 min Charges:  OT General Charges $OT Visit: 1 Procedure OT Evaluation $Initial OT Evaluation Tier I: 1 Procedure OT Treatments $Self Care/Home Management : 8-22 mins G-Codes:     Binnie Kand M.S., OTR/L Pager: [phone]  09/15/2015, 12:27 PM

## 2015-09-15 NOTE — Progress Notes (Signed)
Physical Therapy Treatment Patient Details Name: Agam Eveland MRN: [id number] DOB: September 05, 1951 Today's Date: 10-06-2015    History of Present Illness elective TKR    PT Comments    Patient did well with mobility, however limited by nauseousness.  Will return later this am for therapeutic exercises and will need pm treatment before determination can be made for home today.  Follow Up Recommendations  Home health PT;Supervision - Intermittent     Equipment Recommendations  None recommended by PT    Recommendations for Other Services       Precautions / Restrictions Precautions Precautions: Knee Restrictions RUE Weight Bearing: Weight bearing as tolerated    Mobility  Bed Mobility Overal bed mobility: Needs Assistance Bed Mobility: Supine to Sit     Supine to sit: Min assist     General bed mobility comments: assistance for RLE  Transfers Overall transfer level: Needs assistance Equipment used: Rolling walker (2 wheeled) Transfers: Sit to/from Stand Sit to Stand: Supervision         General transfer comment: cueing on sequencing  Ambulation/Gait Ambulation/Gait assistance: Min guard Ambulation Distance (Feet): 100 Feet Assistive device: Rolling walker (2 wheeled) Gait Pattern/deviations: Step-to pattern;Decreased stance time - left;Antalgic Gait velocity: decreased Gait velocity interpretation: Below normal speed for age/gender General Gait Details: min guard for safety, patient required cueing for sequencing and proper gait pattern.  Patient limited by nausousness and returned to room.   Stairs            Wheelchair Mobility    Modified Rankin (Stroke Patients Only)       Balance Overall balance assessment: No apparent balance deficits (not formally assessed)                                  Cognition Arousal/Alertness: Awake/alert Behavior During Therapy: WFL for tasks assessed/performed Overall Cognitive Status: Within  Functional Limits for tasks assessed                      Exercises      General Comments General comments (skin integrity, edema, etc.): Once returned to room, patient vomited.  RN notified.        Pertinent Vitals/Pain Pain Assessment: Faces Faces Pain Scale: Hurts even more Pain Location: right knee Pain Descriptors / Indicators: Aching;Cramping Pain Intervention(s): Limited activity within patient's tolerance;Monitored during session;Premedicated before session    Home Living                      Prior Function            PT Goals (current goals can now be found in the care plan section) Progress towards PT goals: Progressing toward goals    Frequency  7X/week    PT Plan Current plan remains appropriate    Co-evaluation             End of Session Equipment Utilized During Treatment: Gait belt Activity Tolerance:  (limited due to nauseousness) Patient left: in chair;with family/visitor present;with call bell/phone within reach     Time: [id number] PT Time Calculation (min) (ACUTE ONLY): 22 min  Charges:  $Gait Training: 8-22 mins                    G Codes:      Shanna Cisco 10/06/2015, 10:03 AM  06-Oct-2015 [place], [place]

## 2015-09-15 NOTE — Progress Notes (Signed)
Physical Therapy Treatment Patient Details Name: Casey Webster MRN: [id number] DOB: 10-02-1950 Today's Date: 09/15/2015    History of Present Illness elective TKR    PT Comments    Patient did much better this afternoon.  Progressing nicely with mobility and ROM.  Feel patient safe for discharge home with wife.  Patient will need F/U HHPT to progress mobility and independence, as well as strength and ROM.    Follow Up Recommendations  Home health PT;Supervision - Intermittent     Equipment Recommendations  None recommended by PT    Recommendations for Other Services       Precautions / Restrictions Precautions Precautions: Knee Precaution Comments: Reviewed precautions, no pillow under knee, and use of zero degree Restrictions Weight Bearing Restrictions: Yes RUE Weight Bearing: Weight bearing as tolerated    Mobility  Bed Mobility               General bed mobility comments: Pt OOB in chair  Transfers Overall transfer level: Modified independent Equipment used: Rolling walker (2 wheeled) Transfers: Sit to/from Stand Sit to Stand: Modified independent (Device/Increase time)         General transfer comment: Good hand placement and technique. Supervision for safety.  Ambulation/Gait Ambulation/Gait assistance: Modified independent (Device/Increase time) Ambulation Distance (Feet): 150 Feet Assistive device: Rolling walker (2 wheeled) Gait Pattern/deviations: Step-to pattern;Decreased stance time - right;Antalgic Gait velocity: decreased Gait velocity interpretation: Below normal speed for age/gender     Stairs Stairs: Yes Stairs assistance: Supervision Stair Management: No rails;Backwards Number of Stairs: 1 General stair comments: educated patient and wife on proper technique  Wheelchair Mobility    Modified Rankin (Stroke Patients Only)       Balance Overall balance assessment: No apparent balance deficits (not formally assessed)                                   Cognition Arousal/Alertness: Awake/alert Behavior During Therapy: WFL for tasks assessed/performed Overall Cognitive Status: Within Functional Limits for tasks assessed                      Exercises Total Joint Exercises Ankle Circles/Pumps: AROM;Both;10 reps;Seated Quad Sets: AROM;Strengthening;Both;10 reps;Seated Gluteal Sets: AROM;Both;10 reps;Seated Towel Squeeze: AROM;Strengthening;Both;10 reps;Seated Long Arc Quad: AAROM;Strengthening;Right;10 reps;Seated Knee Flexion: AAROM;Right;10 reps;Seated Goniometric ROM: knee flexion = 86 degrees    General Comments General comments (skin integrity, edema, etc.): No dizziness or nausea with functional mobility this session.      Pertinent Vitals/Pain Pain Assessment: Faces Faces Pain Scale: Hurts little more Pain Location: right knee during exercises Pain Descriptors / Indicators: Aching;Sore Pain Intervention(s): Limited activity within patient's tolerance;Monitored during session;Premedicated before session    [place] expects to be discharged to:: Private residence Living Arrangements: Spouse/significant other Available Help at Discharge: Family;Available 24 hours/day Type of Home: House Home Access: Stairs to enter Entrance Stairs-Rails: None Home Layout: Two level;Able to live on main level with bedroom/bathroom (set up room on mail level temporarily) Home Equipment: Walker - 2 wheels;Crutches;Bedside commode;Shower seat      Prior Function Level of Independence: Independent          PT Goals (current goals can now be found in the care plan section) Acute Rehab PT Goals Patient Stated Goal: go home today Progress towards PT goals: Goals met/education completed, patient discharged from PT    Frequency       PT Plan  Current plan remains appropriate    Co-evaluation             End of Session Equipment Utilized During Treatment: Gait  belt Activity Tolerance: Patient tolerated treatment well Patient left: in chair;with family/visitor present;with call bell/phone within reach     Time: 1330-1404 PT Time Calculation (min) (ACUTE ONLY): 34 min  Charges:  $Gait Training: 8-22 mins $Therapeutic Exercise: 8-22 mins                    G Codes:      [place], Marjorie M 09/15/2015, 2:14 PM  09/15/2015 Margie Moton, PT [phone]     

## 2015-09-15 NOTE — Plan of Care (Signed)
Problem: Acute Rehab PT Goals(only PT should resolve) Goal: Pt Will Go Supine/Side To Sit Outcome: Completed/Met Date Met:  09/15/15 Per pt Goal: Pt Will Go Sit To Supine/Side Outcome: Completed/Met Date Met:  09/15/15 Per pt

## 2015-09-15 NOTE — Discharge Instructions (Signed)

## 2015-09-15 NOTE — Progress Notes (Signed)
Discharge instructions gave to patient and all questions answered. He is ready to D/C.

## 2015-09-15 NOTE — Care Management Note (Signed)
Case Management Note  Patient Details  Name: Nhan Marandola MRN: [id number] Date of Birth: 03-10-1951  Subjective/Objective:        S/p right total knee arthroplasty            Action/Plan: Set up with [facility] for HHPT by MD office. Spoke with patient, no change in discharge plan. CPM, rolling walker and 3N1 have been delivered to his home by [employer]. Patient states that he will have family available to assist after discharge.  Expected Discharge Date:  09/15/15               Expected Discharge Plan:  [place]  In-House Referral:  NA  Discharge planning Services  CM Consult  Post Acute Care Choice:  Home Health, Durable Medical Equipment Choice offered to:  Patient  DME Arranged:  3-N-1, CPM, Walker rolling DME Agency:  [employer]  HH Arranged:  PT HH Agency:  [facility]  Status of Service:  Completed, signed off  Medicare Important Message Given:    Date Medicare IM Given:    Medicare IM give by:    Date Additional Medicare IM Given:    Additional Medicare Important Message give by:     If discussed at [place] of Stay Meetings, dates discussed:    Additional Comments:  Nila Nephew, RN 09/15/2015, 1:05 PM

## 2015-09-15 NOTE — Progress Notes (Signed)
SPORTS MEDICINE AND JOINT REPLACEMENT  Lara Mulch, MD   Carlynn Spry, PA-C [address], [place], [place]  [address]                             [phone]   PROGRESS NOTE  Subjective:  negative for Chest Pain  negative for Shortness of Breath  negative for Nausea/Vomiting   negative for Calf Pain  negative for Bowel Movement   Tolerating Diet: yes         Patient reports pain as 5 on 0-10 scale.    Objective: Vital signs in last 24 hours:   Patient Vitals for the past 24 hrs:  BP Temp Temp src Pulse Resp SpO2 Height Weight  09/15/15 0436 (!) 253/67 mmHg 98.4 F (36.9 C) Oral (!) 105 19 97 % - -  09/14/15 2051 (!) 151/62 mmHg 98.1 F (36.7 C) Oral 63 17 99 % - -  09/14/15 1600 - - - - - - 6\' 3"  (1.905 m) 115.803 kg (255 lb 4.8 oz)  09/14/15 1238 135/75 mmHg 97.9 F (36.6 C) Oral - 16 96 % - -  09/14/15 1200 - - - (!) 52 17 95 % - -  09/14/15 1145 - - - 61 18 98 % - -  09/14/15 1130 (!) 145/73 mmHg - - (!) 57 16 98 % - -  09/14/15 1115 (!) 142/64 mmHg - - (!) 57 16 99 % - -  09/14/15 1100 127/66 mmHg - - (!) 57 (!) 24 98 % - -  09/14/15 1045 - - - (!) 48 12 94 % - -  09/14/15 1030 129/62 mmHg - - (!) 47 15 97 % - -  09/14/15 1015 131/65 mmHg - - (!) 44 13 100 % - -  09/14/15 1000 118/63 mmHg - - (!) 48 14 99 % - -  09/14/15 0945 118/63 mmHg - - (!) 46 18 97 % - -  09/14/15 0930 (!) 105/59 mmHg 97.6 F (36.4 C) - (!) 51 14 100 % - -    @flow {1959:LAST@   Intake/Output from previous day:   12/22 0701 - 12/23 0700 In: 1730 [P.O.:480; I.V.:1200] Out: -    Intake/Output this shift:       Intake/Output      12/22 0701 - 12/23 0700 12/23 0701 - 12/24 0700   P.O. 480    I.V. (mL/kg) 1200 (10.4)    IV Piggyback 50    Total Intake(mL/kg) 1730 (14.9)    Net +1730          Urine Occurrence 2 x       LABORATORY DATA:  Recent Labs  09/14/15 1415 09/15/15 0543  WBC 9.5 9.3  HGB 13.1 12.3*  HCT 37.6* 36.1*  PLT 196 205    Recent Labs   09/14/15 1415 09/15/15 0543  NA  --  132*  K  --  3.8  CL  --  100*  CO2  --  24  BUN  --  15  CREATININE 0.79 0.82  GLUCOSE  --  123*  CALCIUM  --  8.6*   Lab Results  Component Value Date   INR 0.97 09/04/2015    Examination:  General appearance: alert, cooperative and no distress Extremities: Homans sign is negative, no sign of DVT  Wound Exam: clean, dry, intact   Drainage:  None: wound tissue dry  Motor Exam: Quadriceps and Hamstrings Intact  Sensory  Exam: Deep Peroneal normal   Assessment:    1 Day Post-Op  Procedure(s) (LRB): TOTAL KNEE ARTHROPLASTY (Right)  ADDITIONAL DIAGNOSIS:  Active Problems:   S/P total knee arthroplasty  Acute Blood Loss Anemia   Plan: Physical Therapy as ordered Weight Bearing as Tolerated (WBAT)  DVT Prophylaxis:  Lovenox  DISCHARGE PLAN: Home  DISCHARGE NEEDS: HHPT, CPM, Walker and 3-in-1 comode seat         Walida Cajas 09/15/2015, 8:25 AM

## 2015-09-26 NOTE — Discharge Summary (Signed)
[place]   Lara Mulch, MD   Carlynn Spry, PA-C [place], [place], [place]  [address]                             [phone]  PATIENT ID: Casey Webster        MRN:  [id number]          DOB/AGE: 67-03-1951 / 65 y.o.    DISCHARGE SUMMARY  ADMISSION DATE:    09/14/2015 DISCHARGE DATE:   09/15/2015  ADMISSION DIAGNOSIS: primary osteoarthritis right knee    DISCHARGE DIAGNOSIS:  primary osteoarthritis right knee    ADDITIONAL DIAGNOSIS: Active Problems:   S/P total knee arthroplasty  Past Medical History  Diagnosis Date  . High cholesterol   . Arthritis     PROCEDURE: Procedure(s): TOTAL KNEE ARTHROPLASTY on 09/14/2015  CONSULTS:     HISTORY:  See H&P in chart  HOSPITAL COURSE:  Casey Webster is a 65 y.o. admitted on 09/14/2015 and found to have a diagnosis of primary osteoarthritis right knee.  After appropriate laboratory studies were obtained  they were taken to the operating room on 09/14/2015 and underwent Procedure(s): TOTAL KNEE ARTHROPLASTY.   They were given perioperative antibiotics:  Anti-infectives    Start     Dose/Rate Route Frequency Ordered Stop   09/14/15 1400  ceFAZolin (ANCEF) IVPB 2 g/50 mL premix     2 g 100 mL/hr over 30 Minutes Intravenous Every 6 hours 09/14/15 1245 09/14/15 2045   09/13/15 0845  ceFAZolin (ANCEF) 3 g in dextrose 5 % 50 mL IVPB     3 g 160 mL/hr over 30 Minutes Intravenous To ShortStay Surgical 09/13/15 0835 09/14/15 0730    .  Tolerated the procedure well.  Placed with a foley intraoperatively.   POD# 1: Vital signs were stable.  Patient denied Chest pain, shortness of breath, or calf pain.  Patient was started on Lovenox 30 mg subcutaneously twice daily at 8am.  Consults to PT, OT, and care management were made.  The patient was weight bearing as tolerated.  CPM was placed on the operative leg 0-90 degrees for 6-8 hours a day.  Incentive spirometry was taught.  Dressing was changed.        Continued  PT for ambulation and exercise program.  IV saline locked.  O2 discontinued.    The remainder of the hospital course was dedicated to ambulation and strengthening.   The patient was discharged on day 1 post op in  Good condition.  Blood products given:none  DIAGNOSTIC STUDIES: Recent vital signs: No data found.      Recent laboratory studies: No results for input(s): WBC, HGB, HCT, PLT in the last 168 hours. No results for input(s): NA, K, CL, CO2, BUN, CREATININE, GLUCOSE, CALCIUM in the last 168 hours. Lab Results  Component Value Date   INR 0.97 09/04/2015     Recent Radiographic Studies :  Dg Chest 2 View  09/04/2015  CLINICAL DATA:  Knee replacement. EXAM: CHEST  2 VIEW COMPARISON:  No prior . FINDINGS: Mediastinum and hilar structures are normal. Lungs are clear of acute infiltrates. Heart size normal. Biapical pleural parenchymal thickening and nodularity noted consistent with scarring possibly related to prior granulomas disease. No acute bony abnormality . IMPRESSION: Biapical pleural parenchymal nodular thickening most consistent with scarring, possibly from prior granulomas disease. Electronically Signed   By: Marcello Moores  Register   On: 09/04/2015  11:14    DISCHARGE INSTRUCTIONS: Discharge Instructions    CPM    Complete by:  As directed   Continuous passive motion machine (CPM):      Use the CPM from 0 to 90 for 6-8 hours per day.      You may increase by 10 per day.  You may break it up into 2 or 3 sessions per day.      Use CPM for 2 weeks or until you are told to stop.     Call MD / Call 911    Complete by:  As directed   If you experience chest pain or shortness of breath, CALL 911 and be transported to the hospital emergency room.  If you develope a fever above 101 F, pus (white drainage) or increased drainage or redness at the wound, or calf pain, call your surgeon's office.     Change dressing    Complete by:  As directed   Change dressing on  Saturday, then change the dressing daily with sterile 4 x 4 inch gauze dressing and apply TED hose.     Constipation Prevention    Complete by:  As directed   Drink plenty of fluids.  Prune juice may be helpful.  You may use a stool softener, such as Colace (over the counter) 100 mg twice a day.  Use MiraLax (over the counter) for constipation as needed.     Diet - low sodium heart healthy    Complete by:  As directed      Do not put a pillow under the knee. Place it under the heel.    Complete by:  As directed      Driving restrictions    Complete by:  As directed   No driving for 6 weeks     Increase activity slowly as tolerated    Complete by:  As directed      Lifting restrictions    Complete by:  As directed   No lifting for 6 weeks     TED hose    Complete by:  As directed   Use stockings (TED hose) for 2 weeks on both leg(s).  You may remove them at night for sleeping.           DISCHARGE MEDICATIONS:     Medication List    STOP taking these medications        Fish Oil 1000 MG Caps      TAKE these medications        atorvastatin 20 MG tablet  Commonly known as:  LIPITOR  Take 20 mg by mouth daily.     calcium carbonate 1250 (500 Ca) MG tablet  Commonly known as:  OS-CAL - dosed in mg of elemental calcium  Take 1 tablet by mouth daily with breakfast.     co-enzyme Q-10 30 MG capsule  Take 30 mg by mouth 3 (three) times daily.     enoxaparin 40 MG/0.4ML injection  Commonly known as:  LOVENOX  Inject 0.4 mLs (40 mg total) into the skin daily.     fluticasone 50 MCG/ACT nasal spray  Commonly known as:  FLONASE  Place 1 spray into both nostrils daily as needed for allergies.     ibuprofen 400 MG tablet  Commonly known as:  ADVIL,MOTRIN  Take 400 mg by mouth daily as needed for mild pain.     methocarbamol 500 MG tablet  Commonly known as:  ROBAXIN  Take 1-2 tablets (  500-1,000 mg total) by mouth every 6 (six) hours as needed for muscle spasms.      multivitamin with minerals Tabs tablet  Take 1 tablet by mouth daily.     oxyCODONE 5 MG immediate release tablet  Commonly known as:  Oxy IR/ROXICODONE  Take 1-2 tablets (5-10 mg total) by mouth every 3 (three) hours as needed for breakthrough pain.     oxyCODONE 10 mg 12 hr tablet  Commonly known as:  OXYCONTIN  Take 1 tablet (10 mg total) by mouth every 12 (twelve) hours.     vitamin B-12 1000 MCG tablet  Commonly known as:  CYANOCOBALAMIN  Take 1,000 mcg by mouth daily.        FOLLOW UP VISIT:       Follow-up Information    Follow up with Rudean Haskell, MD. Call on 09/28/2015.   Specialty:  Orthopedic Surgery   Contact information:   [address] [place] [address] [phone]       Follow up with [facility].   Why:  They will contact you to schedule home therapy visits.   Contact information:   [address] [place] [address] [phone]       DISPOSITION: HOME   CONDITION:  Good   Casey Webster 09/26/2015, 12:21 PM

## 2016-08-23 DIAGNOSIS — R748 Abnormal levels of other serum enzymes: Secondary | ICD-10-CM | POA: Diagnosis not present

## 2016-08-23 DIAGNOSIS — K760 Fatty (change of) liver, not elsewhere classified: Secondary | ICD-10-CM | POA: Diagnosis not present

## 2016-10-14 DIAGNOSIS — H40013 Open angle with borderline findings, low risk, bilateral: Secondary | ICD-10-CM | POA: Diagnosis not present

## 2016-11-15 DIAGNOSIS — K760 Fatty (change of) liver, not elsewhere classified: Secondary | ICD-10-CM | POA: Diagnosis not present

## 2016-11-21 DIAGNOSIS — R748 Abnormal levels of other serum enzymes: Secondary | ICD-10-CM | POA: Diagnosis not present

## 2016-12-18 DIAGNOSIS — Z23 Encounter for immunization: Secondary | ICD-10-CM | POA: Diagnosis not present

## 2017-01-02 IMAGING — CR DG CHEST 2V
2 series · 2 of 2 positions shown · non-contrast
Comparison: No prior .

CLINICAL DATA: Knee replacement.

EXAM:
CHEST  2 VIEW

[w chest pa]
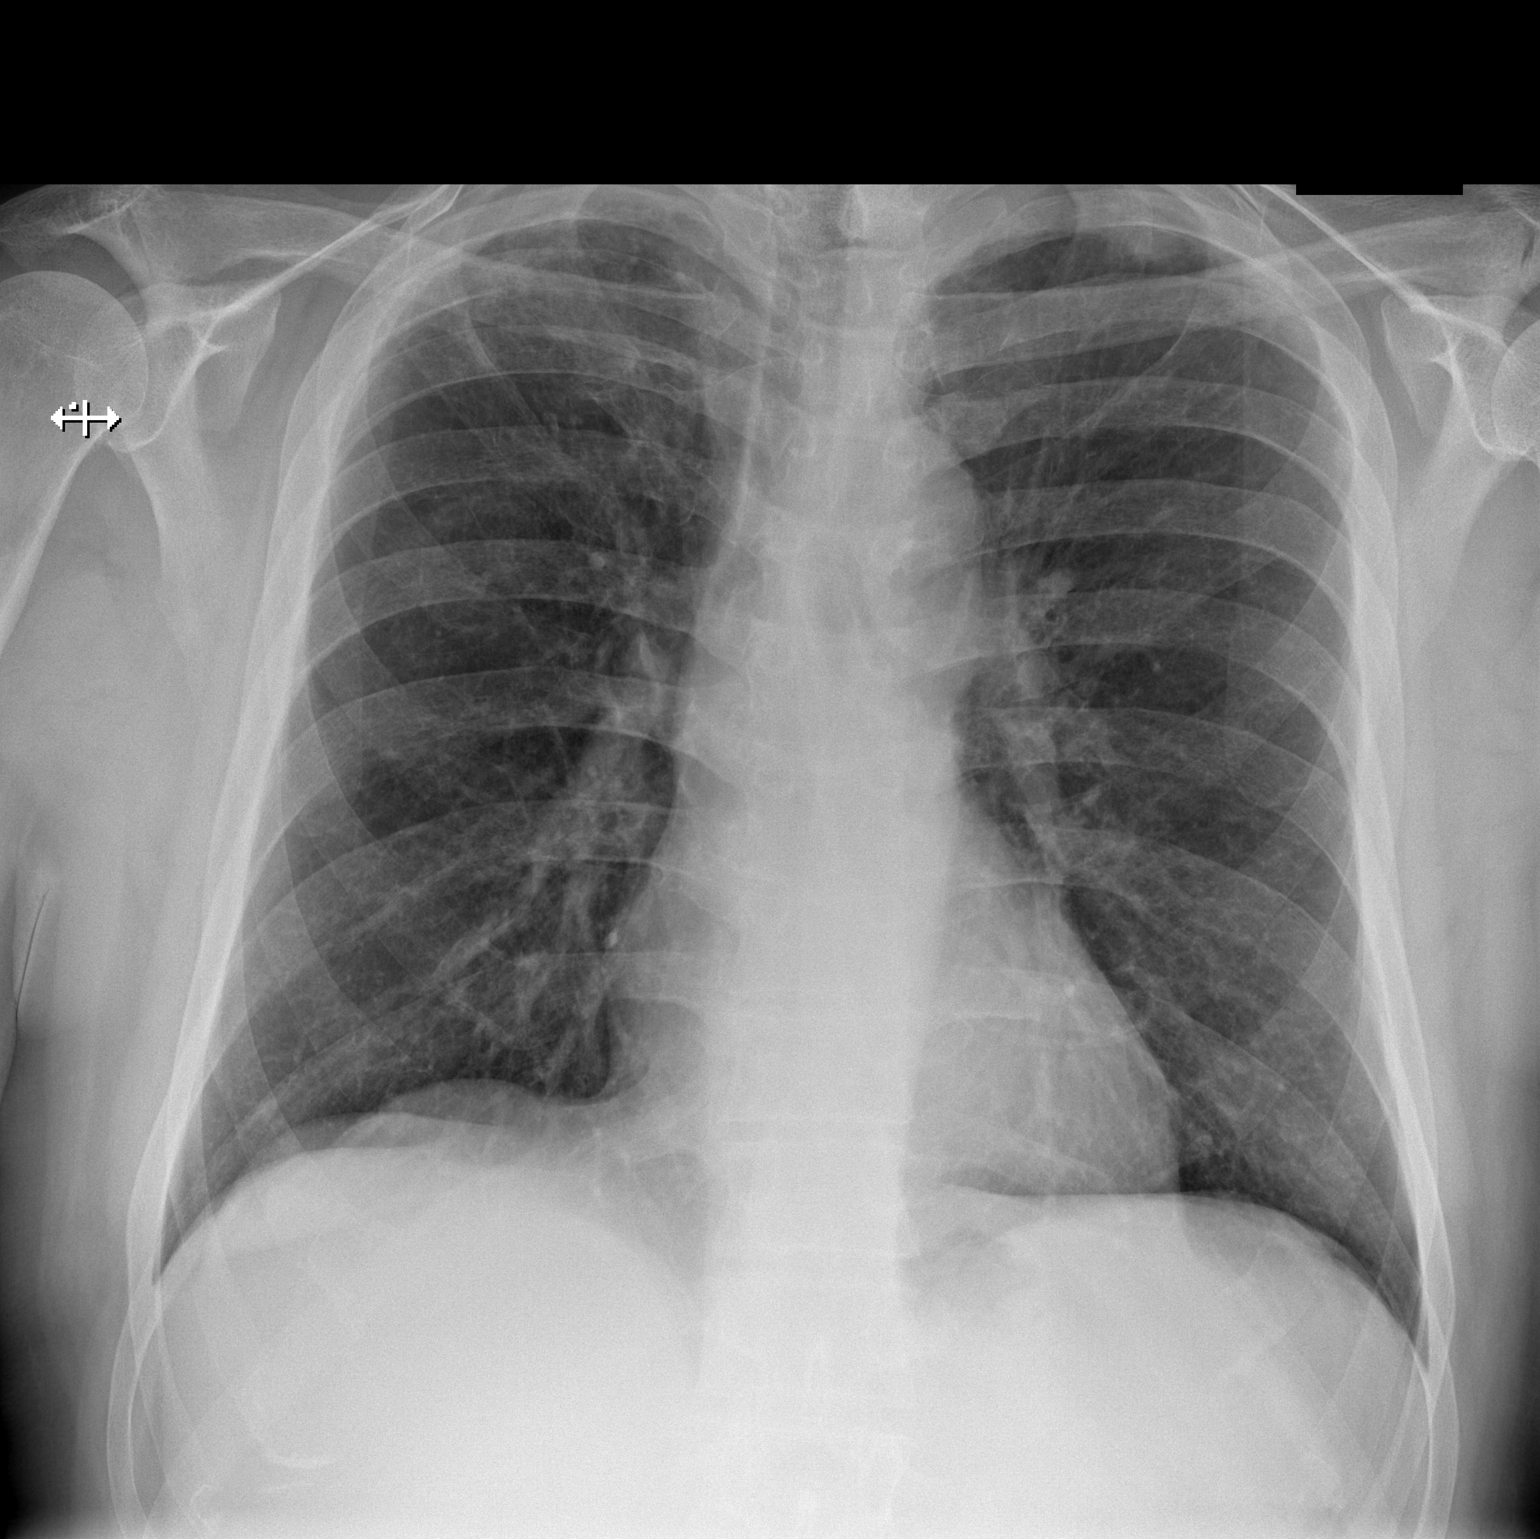

[w chest lat]
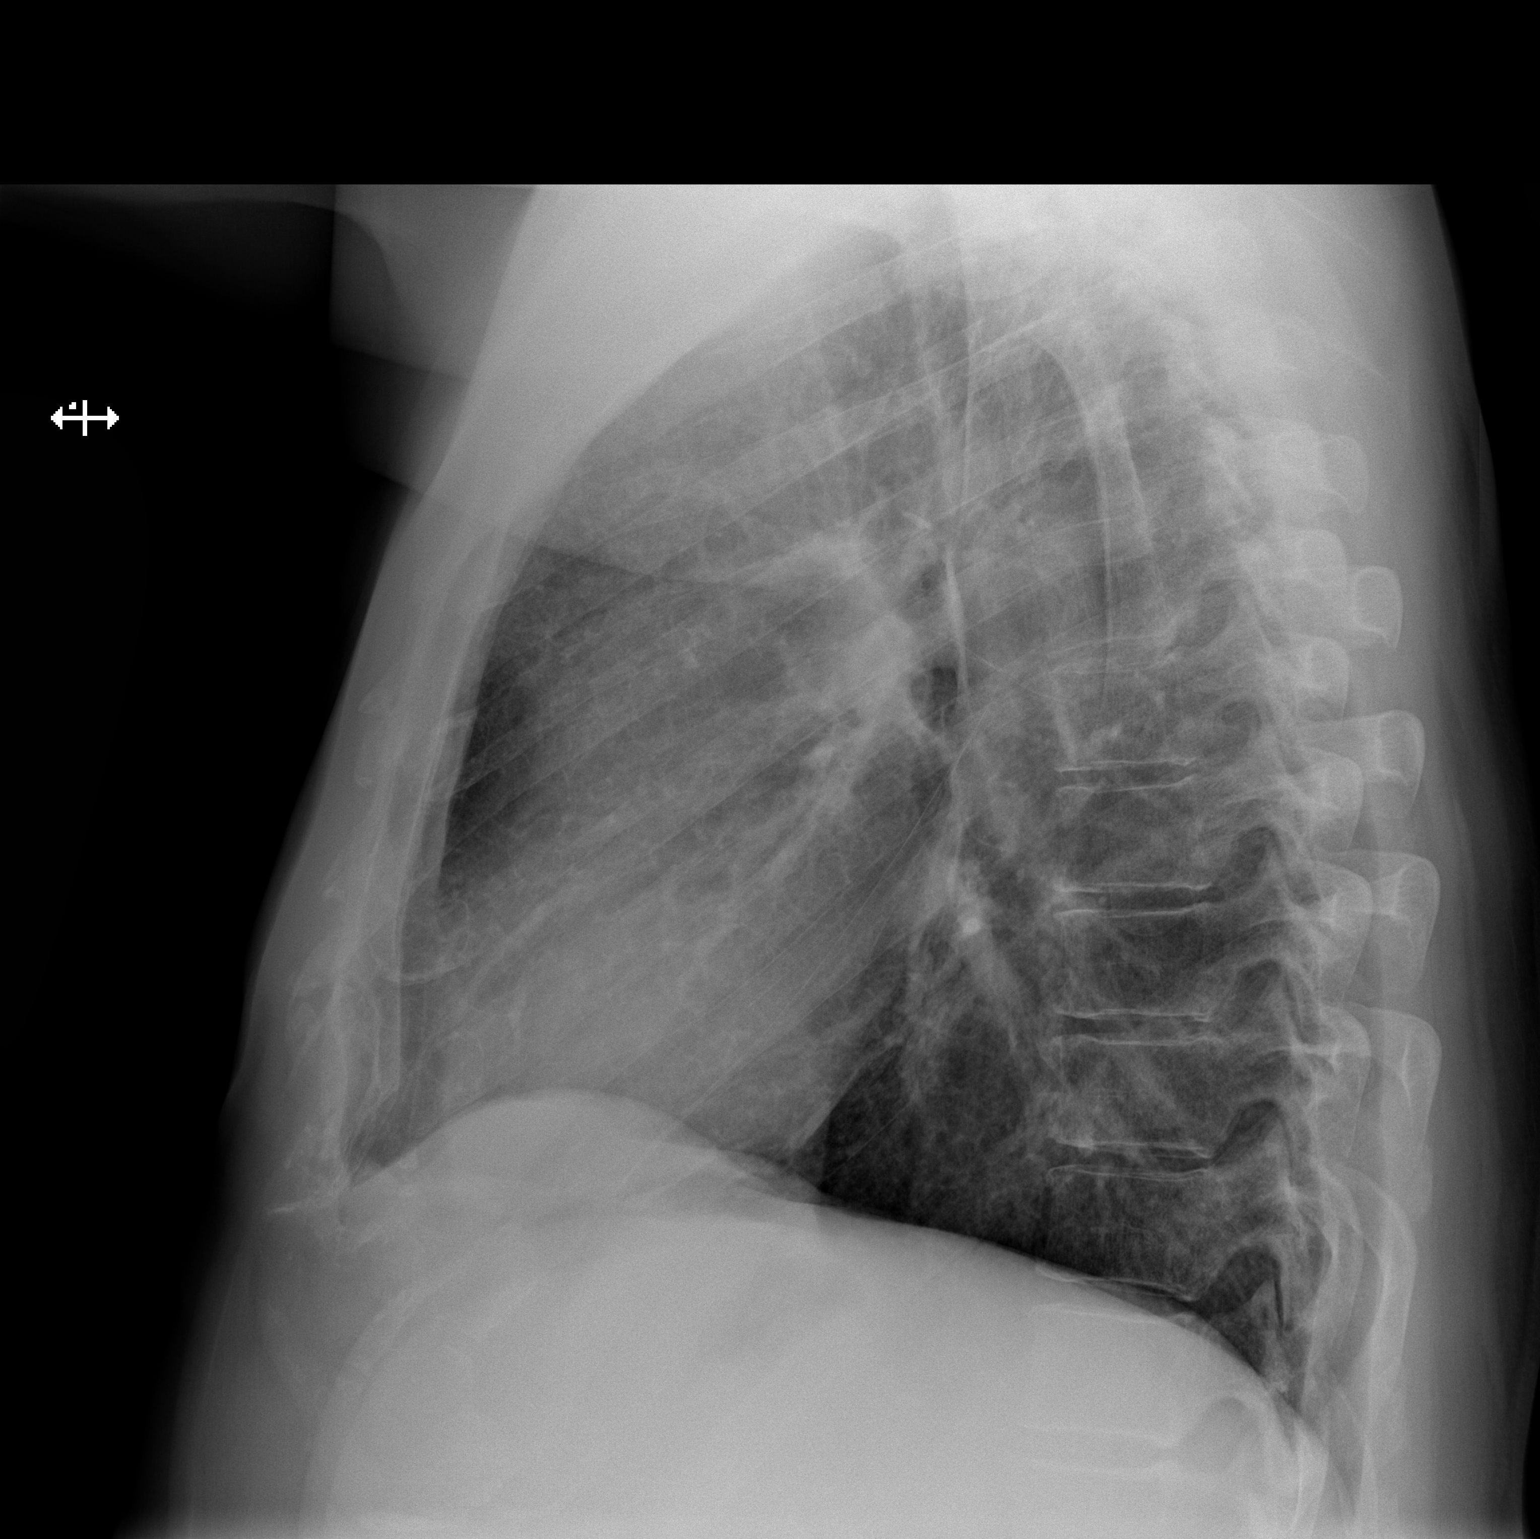

[2 of 2 positions shown; findings below may reference images not displayed]

FINDINGS: Mediastinum and hilar structures are normal. Lungs are clear of
acute infiltrates. Heart size normal. Biapical pleural parenchymal
thickening and nodularity noted consistent with scarring possibly
related to prior granulomas disease. No acute bony abnormality .
IMPRESSION: Biapical pleural parenchymal nodular thickening most consistent with
scarring, possibly from prior granulomas disease.

## 2017-01-15 DIAGNOSIS — Z23 Encounter for immunization: Secondary | ICD-10-CM | POA: Diagnosis not present

## 2017-02-21 DIAGNOSIS — K760 Fatty (change of) liver, not elsewhere classified: Secondary | ICD-10-CM | POA: Diagnosis not present

## 2017-04-15 DIAGNOSIS — H40013 Open angle with borderline findings, low risk, bilateral: Secondary | ICD-10-CM | POA: Diagnosis not present

## 2017-04-29 DIAGNOSIS — K760 Fatty (change of) liver, not elsewhere classified: Secondary | ICD-10-CM | POA: Diagnosis not present

## 2017-05-15 DIAGNOSIS — Z23 Encounter for immunization: Secondary | ICD-10-CM | POA: Diagnosis not present

## 2017-05-15 DIAGNOSIS — Z9181 History of falling: Secondary | ICD-10-CM | POA: Diagnosis not present

## 2017-05-15 DIAGNOSIS — Z0000 Encounter for general adult medical examination without abnormal findings: Secondary | ICD-10-CM | POA: Diagnosis not present

## 2017-05-19 ENCOUNTER — Encounter (INDEPENDENT_AMBULATORY_CARE_PROVIDER_SITE_OTHER): Payer: Self-pay

## 2017-05-20 DIAGNOSIS — D519 Vitamin B12 deficiency anemia, unspecified: Secondary | ICD-10-CM | POA: Diagnosis not present

## 2017-05-20 DIAGNOSIS — Z79899 Other long term (current) drug therapy: Secondary | ICD-10-CM | POA: Diagnosis not present

## 2017-05-20 DIAGNOSIS — E785 Hyperlipidemia, unspecified: Secondary | ICD-10-CM | POA: Diagnosis not present

## 2017-05-20 DIAGNOSIS — Z125 Encounter for screening for malignant neoplasm of prostate: Secondary | ICD-10-CM | POA: Diagnosis not present

## 2017-05-21 ENCOUNTER — Ambulatory Visit: Payer: Self-pay | Admitting: Sports Medicine

## 2017-05-28 ENCOUNTER — Ambulatory Visit: Payer: Self-pay

## 2017-05-28 ENCOUNTER — Ambulatory Visit (INDEPENDENT_AMBULATORY_CARE_PROVIDER_SITE_OTHER): Payer: Medicare Other | Admitting: Sports Medicine

## 2017-05-28 ENCOUNTER — Encounter: Payer: Self-pay | Admitting: Sports Medicine

## 2017-05-28 ENCOUNTER — Telehealth: Payer: Self-pay | Admitting: *Deleted

## 2017-05-28 VITALS — BP 111/60 | HR 60 | Ht 75.0 in | Wt 250.0 lb

## 2017-05-28 DIAGNOSIS — M792 Neuralgia and neuritis, unspecified: Secondary | ICD-10-CM

## 2017-05-28 DIAGNOSIS — M2041 Other hammer toe(s) (acquired), right foot: Secondary | ICD-10-CM

## 2017-05-28 DIAGNOSIS — M79671 Pain in right foot: Secondary | ICD-10-CM

## 2017-05-28 DIAGNOSIS — M79672 Pain in left foot: Secondary | ICD-10-CM

## 2017-05-28 NOTE — Progress Notes (Signed)
Subjective: Casey Webster is a 66 y.o. male patient who presents to office for evaluation of Right> Left foot pain. Patient complains of progressive pain especially over the last year in the Right>Left foot at the 1-5 toes.Discomfort is most with rubbing from shoes. Patient admits to burning in both feet that has been going on for 3-4 years and is progressive. 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 Patient denies any other pedal complaints.   Patient Active Problem List   Diagnosis Date Noted  . 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 atorvastatin (LIPITOR) 20 MG tablet Take 20 mg by mouth daily.    . calcium carbonate (OS-CAL - DOSED IN MG OF ELEMENTAL CALCIUM) 1250 (500 CA) MG tablet Take 1 tablet by mouth daily with breakfast.    . co-enzyme Q-10 30 MG capsule Take 30 mg by mouth 3 (three) times daily.    . fluticasone (FLONASE) 50 MCG/ACT nasal spray Place 1 spray into both nostrils daily as needed for allergies.     . Multiple Vitamin (MULTIVITAMIN WITH MINERALS) TABS tablet Take 1 tablet by mouth daily.    . vitamin B-12 (CYANOCOBALAMIN) 1000 MCG tablet Take 1,000 mcg by mouth daily.    Marland Kitchen enoxaparin (LOVENOX) 40 MG/0.4ML injection Inject 0.4 mLs (40 mg total) into the skin daily. (Patient not taking: Reported on 05/28/2017) 13 Syringe 0  . ibuprofen (ADVIL,MOTRIN) 400 MG tablet Take 400 mg by mouth daily as needed for mild pain.    . methocarbamol (ROBAXIN) 500 MG tablet Take 1-2 tablets (500-1,000 mg total) by mouth every 6 (six) hours as needed for muscle spasms. (Patient not taking: Reported on 05/28/2017) 60 tablet 2  . oxyCODONE (OXY IR/ROXICODONE) 5 MG immediate release tablet Take 1-2 tablets (5-10 mg total) by mouth every 3 (three) hours as needed for breakthrough pain. (Patient not taking: Reported on 05/28/2017) 90 tablet 0  .  oxyCODONE (OXYCONTIN) 10 mg 12 hr tablet Take 1 tablet (10 mg total) by mouth every 12 (twelve) hours. (Patient not taking: Reported on 05/28/2017) 30 tablet 0   No current facility-administered medications on file prior to visit.     No Known Allergies  Objective:  General: Alert and oriented x3 in no acute distress  Dermatology: Small soft hyperkeratotic lesions overlying 2-5 PIPJ dorsally bilateral R>L. No open lesions bilateral lower extremities, no webspace macerations, no ecchymosis bilateral, all nails x 10 are well manicured.  Vascular: Dorsalis Pedis and Posterior Tibial pedal pulses 2/4, Capillary Fill Time 3 seconds,(+) pedal hair growth bilateral, no edema bilateral lower extremities, Temperature gradient within normal limits.  Neurology: Johney Maine sensation intact via light touch bilateral, Protective sensation absent to toes  with Thornell Mule Monofilament bilateral, No babinski sign present bilateral.   Musculoskeletal: Semi-flexible hammertoes 1-5 with Mild tenderness with palpation at PIPJ Right>Left. Significant Pes Cavus deformity. No pain with calf compression bilateral.  Strength within normal limits in all groups bilateral.        Assessment and Plan: Problem List Items Addressed This Visit    None    Visit Diagnoses    Hammer toe of right foot    -  Primary   Neuritis       Foot pain, bilateral           -Complete examination performed -Xrays were not obtained due  to machine being down or serviced  -Discussed treatement options -Rx NCV to eval possible underlying neurologic component to hamemrtoes -Patient to return to office after nerve testing or sooner if condition worsens.Will depending on the results of the study, xray and further discuss surgery at next visit.   Landis Martins, DPM

## 2017-05-28 NOTE — Progress Notes (Signed)
   Subjective:    Patient ID: Casey Webster, male    DOB: July 04, 1951, 66 y.o.   MRN: [id number]  HPI  Concerned with hammer toes on right foot.     Review of Systems  HENT: Positive for tinnitus.   Musculoskeletal: Positive for arthralgias.  All other systems reviewed and are negative.      Objective:   Physical Exam        Assessment & Plan:

## 2017-05-28 NOTE — Telephone Encounter (Addendum)
-----   Message from [place], [place] sent at 05/28/2017 10:20 AM EDT ----- Regarding: Nerve conduction tests Numbness tingling and burning 3-4 years lack of protective sensation to toes with hammertoe deformity. Orders faxed to [facility].

## 2017-06-04 DIAGNOSIS — Z87891 Personal history of nicotine dependence: Secondary | ICD-10-CM | POA: Diagnosis not present

## 2017-06-05 ENCOUNTER — Telehealth: Payer: Self-pay | Admitting: Sports Medicine

## 2017-06-05 NOTE — Telephone Encounter (Signed)
I informed pt's wife, Izora Gala I had confirmation [facility] had received the orders for the NCV with EMG. I informed [facility] was taking in other [facility] neurology patients and may be working their way through referrals, but she could call and schedule, gave appt [phone].

## 2017-06-05 NOTE — Telephone Encounter (Signed)
Pt's wife called saying husband was supposed to be set up for a nerve conduction test but they have not heard anything. Wants to make sure they did not fall through the cracks. Requested a call back at [phone].

## 2017-06-09 ENCOUNTER — Telehealth: Payer: Self-pay | Admitting: Sports Medicine

## 2017-06-09 ENCOUNTER — Encounter: Payer: Self-pay | Admitting: Cardiology

## 2017-06-09 ENCOUNTER — Ambulatory Visit (INDEPENDENT_AMBULATORY_CARE_PROVIDER_SITE_OTHER): Payer: Medicare Other | Admitting: Cardiology

## 2017-06-09 VITALS — BP 114/70 | HR 58 | Ht 75.0 in | Wt 258.1 lb

## 2017-06-09 DIAGNOSIS — R001 Bradycardia, unspecified: Secondary | ICD-10-CM

## 2017-06-09 DIAGNOSIS — I2584 Coronary atherosclerosis due to calcified coronary lesion: Secondary | ICD-10-CM

## 2017-06-09 DIAGNOSIS — R918 Other nonspecific abnormal finding of lung field: Secondary | ICD-10-CM | POA: Insufficient documentation

## 2017-06-09 DIAGNOSIS — I2510 Atherosclerotic heart disease of native coronary artery without angina pectoris: Secondary | ICD-10-CM | POA: Diagnosis not present

## 2017-06-09 DIAGNOSIS — E785 Hyperlipidemia, unspecified: Secondary | ICD-10-CM | POA: Diagnosis not present

## 2017-06-09 DIAGNOSIS — Z7189 Other specified counseling: Secondary | ICD-10-CM | POA: Diagnosis not present

## 2017-06-09 DIAGNOSIS — I288 Other diseases of pulmonary vessels: Secondary | ICD-10-CM | POA: Diagnosis not present

## 2017-06-09 HISTORY — DX: Other diseases of pulmonary vessels: I28.8

## 2017-06-09 HISTORY — DX: Hyperlipidemia, unspecified: E78.5

## 2017-06-09 HISTORY — DX: Atherosclerotic heart disease of native coronary artery without angina pectoris: I25.10

## 2017-06-09 MED ORDER — ASPIRIN EC 81 MG PO TBEC: 81.0000 mg | DELAYED_RELEASE_TABLET | Freq: Every day | ORAL | 3 refills | Status: AC

## 2017-06-09 NOTE — Progress Notes (Signed)
Cardiology Office Note:    Date:  06/09/2017   ID:  Casey Webster, DOB 13-May-1951, MRN [id number]  PCP:  Angelina Sheriff, MD  Cardiologist:  Shirlee More, MD   Referring MD: Angelina Sheriff, MD  ASSESSMENT:    1. Bradycardia   2. Coronary artery calcification   3. Dilatation of pulmonic artery (HCC)   4. Encounter for cardiac risk counseling   5. Hyperlipidemia, unspecified hyperlipidemia type    PLAN:    In order of problems listed above:  1. Clinically I don't feel that this is a concern unless he was having heart rates consistently less than 50 asymptomatic I would not pursue a heart rate monitoring this time. 2. This places him at increased cardiovascular risk and after discussion he agrees to undergo any imaging stress test for cardiovascular risk. If he has high risk markers coronary angiography be appropriate. In the interim he'll continue his statin and add low-dose aspirin 81 mg daily. 3. Isolated finding without evidence of right heart failure or symptoms of exercise intolerance or shortness of breath. Echocardiogram ordered in order to evaluate right heart function pulmonary artery pressures and to independently assess the size of his pulmonary artery. 4. Cardiovascular risk is high with age hyperlipidemia and coronary artery calcification, Myoview exercise stress test ordered 5. Stable continue his statin  Next appointment: in 2 months, he'll have a one-month trip to [place] and will plan to do his diagnostic testing on return.  Medication Adjustments/Labs and Tests Ordered: Current medicines are reviewed at length with the patient today.  Concerns regarding medicines are outlined above.  Orders Placed This Encounter  Procedures  . Myocardial Perfusion Imaging  . ECHOCARDIOGRAM COMPLETE   Meds ordered this encounter  Medications  . aspirin EC 81 MG tablet    Sig: Take 1 tablet (81 mg total) by mouth daily.    Dispense:  30 tablet    Refill:  3     Chief  Complaint  Patient presents with  . New Patient (Initial Visit)    per Dr Lin Landsman to evaluate coronary artery calcification seen on CT Scan   . Bradycardia    History of Present Illness:    Casey Webster is a 66 y.o. male with lung nodules followed on CT scan , hyperlipidemia and CAC on CT scan who is being seen today for the evaluation of bradycardia and cardiovascular risk  at the request of Angelina Sheriff, MD.  recently seen as an outpatient his EKG recorded a heart rate of 45 bpm but on review the EKG the heart rate was 50-55/bpm. He has no history of slow heart rates and when he exercises at the [place] is [place] heart rate is 100-110 bpm. He is on no rate slowing medications no exercise intolerance palpitation or syncope.  His cardiovascular risk include male sex age and hyperlipidemia as well as previous cigarette smoking. He has no known history of vascular disease. CT shows coronary artery calcification. He relates 10-20 years ago a normal treadmill EKG as an outpatient.  He has no history of pulmonary vascular disease recent CT of the chest showed dilation of the pulmonary artery. He has no edema exercise intolerance or shortness of breath. CT scan did show lung and emphysema as well as stable nodules   Past Medical History:  Diagnosis Date  . Arthritis   . Coronary artery calcification 06/09/2017  . Dilatation of pulmonic artery ([place]) 06/09/2017  . Emphysema of lung (  HCC)   . Hepatic steatosis   . High cholesterol   . Hyperlipidemia 06/09/2017  . Primary osteoarthritis of right knee 05/23/2014  . Pulmonary nodules   . Right knee pain 05/23/2014  . Scarring of lung     Past Surgical History:  Procedure Laterality Date  . COLONOSCOPY    . EYE SURGERY     BILATERAL CATARACTS  . TOTAL KNEE ARTHROPLASTY Right 09/14/2015   Procedure: TOTAL KNEE ARTHROPLASTY;  Surgeon: Vickey Huger, MD;  Location: [facility];  Service: Orthopedics;  Laterality: Right;    Current Medications: Current  Meds  Medication Sig  . atorvastatin (LIPITOR) 20 MG tablet Take 10 mg by mouth daily.  . Calcium Carbonate (CALCIUM 600 PO) Take 1 tablet by mouth daily.  . Cetirizine HCl 10 MG CAPS Take 1 capsule by mouth daily.  . Multiple Vitamin (MULTIVITAMIN WITH MINERALS) TABS tablet Take 1 tablet by mouth daily.  . sildenafil (VIAGRA) 50 MG tablet Take 50 mg by mouth daily as needed for erectile dysfunction.  Marland Kitchen testosterone cypionate (DEPOTESTOSTERONE CYPIONATE) 200 MG/ML injection every 14 (fourteen) days.  . vitamin B-12 (CYANOCOBALAMIN) 1000 MCG tablet Take 1,000 mcg by mouth daily.     Allergies:   Patient has no known allergies.   Social History   Social History  . Marital status: Married    Spouse name: N/A  . Number of children: N/A  . Years of education: N/A   Social History Main Topics  . Smoking status: Former Smoker    Packs/day: 1.00    Years: 40.00    Types: Cigarettes    Quit date: 10/05/2011  . Smokeless tobacco: Never Used  . Alcohol use 12.6 oz/week    21 Shots of liquor per week     Comment: VODKA  . Drug use: No  . Sexual activity: Not Asked   Other Topics Concern  . None   Social History Narrative  . None     Family History: The patient's family history includes Heart Problems in his father and sister; Heart murmur in his sister; Stroke in his mother.  ROS:   ROS Please see the history of present illness.he has had foot pain and symptoms of neuropathy     All other systems reviewed and are negative.  EKGs/Labs/Other Studies Reviewed:    The following studies were reviewed today: Prior to visit I reviewed his PCP records and CT of the chest as well as EKG faxed to my office independently reviewed  EKG:  EKG  Done 05/15/17 [place], normal 55 BPM CT 06/04/17 with stable lung nodules, CAC and dilated pulmonary artery 3.8 cm. Recent Labs: No results found for requested labs within last 8760 hours.  Recent Lipid Panel No results found for: CHOL, TRIG, HDL,  CHOLHDL, VLDL, LDLCALC, LDLDIRECT  Physical Exam:    VS:  BP 114/70 (BP Location: Left Arm, Patient Position: Sitting)   Pulse (!) 58   Ht 6\' 3"  (1.905 m)   Wt 258 lb 1.9 oz (117.1 kg)   SpO2 96%   BMI 32.26 kg/m     Wt Readings from Last 3 Encounters:  06/09/17 258 lb 1.9 oz (117.1 kg)  05/28/17 250 lb (113.4 kg)  09/14/15 255 lb 4.8 oz (115.8 kg)     GEN:  Well nourished, well developed in no acute distress HEENT: Normal NECK: No JVD; No carotid bruits LYMPHATICS: No lymphadenopathy CARDIAC: RRR, no murmurs, rubs, gallops RESPIRATORY:  Clear to auscultation without rales, wheezing or rhonchi  ABDOMEN: Soft, non-tender, non-distended MUSCULOSKELETAL:  No edema; No deformity  SKIN: Warm and dry NEUROLOGIC:  Alert and oriented x 3 PSYCHIATRIC:  Normal affect     Signed, Shirlee More, MD  06/09/2017 11:01 AM    [place]

## 2017-06-09 NOTE — Telephone Encounter (Signed)
I was calling to see what my Casey Webster will cover and what he would have to pay for a NCV and EMG with [facility].

## 2017-06-09 NOTE — Patient Instructions (Addendum)
Medication Instructions:  Your physician has recommended you make the following change in your medication:  START aspirin EC 81 mg daily  Labwork: None  Testing/Procedures: Your physician has requested that you have an echocardiogram. Echocardiography is a painless test that uses sound waves to create images of your heart. It provides your doctor with information about the size and shape of your heart and how well your heart's chambers and valves are working. This procedure takes approximately one hour. There are no restrictions for this procedure.  Your physician has requested that you have en exercise stress myoview. For further information please visit [email or web address]. Please follow instruction sheet, as given.  Follow-Up: Your physician recommends that you schedule a follow-up appointment in: 2 months.   Any Other Special Instructions Will Be Listed Below (If Applicable).     If you need a refill on your cardiac medications before your next appointment, please call your pharmacy.

## 2017-07-15 ENCOUNTER — Telehealth (HOSPITAL_COMMUNITY): Payer: Self-pay | Admitting: *Deleted

## 2017-07-15 NOTE — Telephone Encounter (Signed)
Patient given detailed instructions per Myocardial Perfusion Study Information Sheet for the test on 07/21/17 at 0945. Patient notified to arrive 15 minutes early and that it is imperative to arrive on time for appointment to keep from having the test rescheduled.  If you need to cancel or reschedule your appointment, please call the office within 24 hours of your appointment. . Patient verbalized understanding.Lileigh Fahringer, [place]anae [place]

## 2017-07-21 ENCOUNTER — Ambulatory Visit (HOSPITAL_BASED_OUTPATIENT_CLINIC_OR_DEPARTMENT_OTHER): Payer: Medicare Other

## 2017-07-21 ENCOUNTER — Other Ambulatory Visit: Payer: Self-pay

## 2017-07-21 ENCOUNTER — Ambulatory Visit (HOSPITAL_COMMUNITY): Payer: Medicare Other | Attending: Cardiology

## 2017-07-21 DIAGNOSIS — R9439 Abnormal result of other cardiovascular function study: Secondary | ICD-10-CM | POA: Insufficient documentation

## 2017-07-21 DIAGNOSIS — I059 Rheumatic mitral valve disease, unspecified: Secondary | ICD-10-CM | POA: Insufficient documentation

## 2017-07-21 DIAGNOSIS — I2510 Atherosclerotic heart disease of native coronary artery without angina pectoris: Secondary | ICD-10-CM

## 2017-07-21 DIAGNOSIS — I288 Other diseases of pulmonary vessels: Secondary | ICD-10-CM | POA: Diagnosis not present

## 2017-07-21 DIAGNOSIS — I2584 Coronary atherosclerosis due to calcified coronary lesion: Secondary | ICD-10-CM

## 2017-07-21 LAB — MYOCARDIAL PERFUSION IMAGING
CHL CUP NUCLEAR SRS: 0
CHL CUP NUCLEAR SSS: 2
CSEPPHR: 63 {beats}/min
LV dias vol: 155 mL (ref 62–150)
LV sys vol: 65 mL
RATE: 0.33
Rest HR: 51 {beats}/min
SDS: 2
TID: 1.02

## 2017-07-21 MED ORDER — REGADENOSON 0.4 MG/5ML IV SOLN
0.4000 mg | Freq: Once | INTRAVENOUS | Status: AC
  Administered 2017-07-21: 0.4 mg via INTRAVENOUS

## 2017-07-21 MED ORDER — TECHNETIUM TC 99M TETROFOSMIN IV KIT
33.0000 | PACK | Freq: Once | INTRAVENOUS | Status: AC | PRN
  Administered 2017-07-21: 33 via INTRAVENOUS
  Filled 2017-07-21: qty 33

## 2017-07-21 MED ORDER — TECHNETIUM TC 99M TETROFOSMIN IV KIT
11.0000 | PACK | Freq: Once | INTRAVENOUS | Status: AC | PRN
  Administered 2017-07-21: 11 via INTRAVENOUS
  Filled 2017-07-21: qty 11

## 2017-07-22 DIAGNOSIS — K760 Fatty (change of) liver, not elsewhere classified: Secondary | ICD-10-CM | POA: Diagnosis not present

## 2017-07-29 DIAGNOSIS — H40013 Open angle with borderline findings, low risk, bilateral: Secondary | ICD-10-CM | POA: Diagnosis not present

## 2017-08-06 DIAGNOSIS — R202 Paresthesia of skin: Secondary | ICD-10-CM | POA: Diagnosis not present

## 2017-08-08 NOTE — Progress Notes (Signed)
Cardiology Office Note:    Date:  08/11/2017   ID:  Casey Webster, DOB 1951/06/15, MRN [id number]  PCP:  Angelina Sheriff, MD  Cardiologist:  Shirlee More, MD    Referring MD: Angelina Sheriff, MD    ASSESSMENT:    1. Coronary artery calcification   2. Dilatation of pulmonic artery (HCC)    PLAN:    In order of problems listed above:  1. Stable his myocardial perfusion study is a variant of normal with apical thinning attenuation are mildly abnormal no evidence of myocardial infarction by EKG echo or left ventricular segmental contractility and after discussion of continue current treatment low-dose aspirin 81 mg daily statin in the absence of symptoms I will see him in 2 years to consider follow-up stress perfusion study. 2. Stable, no evidence of right ventricular dysfunction or pulmonary hypertension on echo.   Next appointment: As needed, I asked him to contact me in 2 years   Medication Adjustments/Labs and Tests Ordered: Current medicines are reviewed at length with the patient today.  Concerns regarding medicines are outlined above.  No orders of the defined types were placed in this encounter.  No orders of the defined types were placed in this encounter.   Chief Complaint  Patient presents with  . Follow-up    after heart testing    History of Present Illness:    Casey Webster is a 66 y.o. male with a hx of CAC on CT scan  last seen 2 months ago. Compliance with diet, lifestyle and medications: Yes, he does take aspirin 81 mg daily and statin.  He is having no angina dyspnea palpitations or syncope.  His healthcare C is high and we reviewed the findings of his myocardial perfusion study there is a small apical defect present there is no evidence of myocardial infarction this could represent physiologic apical thinning and attenuation or minimal ischemia.  I certainly would advise coronary revascularization and he is comfortable with this approach.   Fortunately he has stopped smoking Past Medical History:  Diagnosis Date  . Arthritis   . Coronary artery calcification 06/09/2017  . Dilatation of pulmonic artery ([place]) 06/09/2017  . Emphysema of lung ([place])   . Hepatic steatosis   . High cholesterol   . Hyperlipidemia 06/09/2017  . Primary osteoarthritis of right knee 05/23/2014  . Pulmonary nodules   . Right knee pain 05/23/2014  . Scarring of lung     Past Surgical History:  Procedure Laterality Date  . COLONOSCOPY    . EYE SURGERY     BILATERAL CATARACTS  . TOTAL KNEE ARTHROPLASTY Right 09/14/2015   Performed by Vickey Huger, MD at [facility] OR    Current Medications: Current Meds  Medication Sig  . atorvastatin (LIPITOR) 20 MG tablet Take 10 mg by mouth daily.  . Cetirizine HCl 10 MG CAPS Take 1 capsule by mouth daily.  . Multiple Vitamin (MULTIVITAMIN WITH MINERALS) TABS tablet Take 1 tablet by mouth daily.  . sildenafil (VIAGRA) 50 MG tablet Take 50 mg by mouth daily as needed for erectile dysfunction.  Marland Kitchen testosterone cypionate (DEPOTESTOSTERONE CYPIONATE) 200 MG/ML injection every 14 (fourteen) days.  . vitamin B-12 (CYANOCOBALAMIN) 1000 MCG tablet Take 1,000 mcg by mouth daily.     Allergies:   Patient has no known allergies.   Social History   Socioeconomic History  . Marital status: Married    Spouse name: None  . Number of children: None  . Years  of education: None  . Highest education level: None  Social Needs  . Financial resource strain: None  . Food insecurity - worry: None  . Food insecurity - inability: None  . Transportation needs - medical: None  . Transportation needs - non-medical: None  Occupational History  . None  Tobacco Use  . Smoking status: Former Smoker    Packs/day: 1.00    Years: 40.00    Pack years: 40.00    Types: Cigarettes    Last attempt to quit: 10/05/2011    Years since quitting: 5.8  . Smokeless tobacco: Never Used  Substance and Sexual Activity  . Alcohol use: Yes     Alcohol/week: 12.6 oz    Types: 21 Shots of liquor per week    Comment: VODKA  . Drug use: No  . Sexual activity: None  Other Topics Concern  . None  Social History Narrative  . None     Family History: The patient's family history includes Heart Problems in his father and sister; Heart murmur in his sister; Stroke in his mother. ROS:   Please see the history of present illness.    All other systems reviewed and are negative.  EKGs/Labs/Other Studies Reviewed:    The following studies were reviewed today:  MPI:  Perfusion Summary Thinning of the inferior wall and no diaphragmatic attenuation on RAW images Cannot r/o small inferior wall infarct with mild peri infarct ischemia Wall motion/EF Normal 58%    Overall Study Impression Myocardial perfusion is abnormal. Findings consistent with prior myocardial infarction with peri-infarct ischemia. This is a low risk study. Overall left ventricular systolic function was normal. LV cavity size is normal. Nuclear stress EF: 58%. The left ventricular ejection fraction is normal (55-65%). There is no prior study for comparison.   Echo: Study Conclusions - Left ventricle: The cavity size was normal. Wall thickness was   increased in a pattern of mild LVH. Systolic function was normal.   The estimated ejection fraction was in the range of 60% to 65%.   Wall motion was normal; there were no regional wall motion   abnormalities. - Aortic valve: There was trivial regurgitation. - Mitral valve: Calcified annulus.  Recent Labs: No results found for requested labs within last 8760 hours.  Recent Lipid Panel No results found for: CHOL, TRIG, HDL, CHOLHDL, VLDL, LDLCALC, LDLDIRECT  Physical Exam:    VS:  BP 120/80 (BP Location: Right Arm, Patient Position: Sitting, Cuff Size: Normal)   Pulse (!) 54   Ht 6\' 3"  (1.905 m)   Wt 262 lb (118.8 kg)   SpO2 98%   BMI 32.75 kg/m     Wt Readings from Last 3 Encounters:  08/11/17 262 lb (118.8  kg)  07/21/17 258 lb (117 kg)  06/09/17 258 lb 1.9 oz (117.1 kg)     GEN:  Well nourished, well developed in no acute distress HEENT: Normal NECK: No JVD; No carotid bruits LYMPHATICS: No lymphadenopathy CARDIAC: RRR, no murmurs, rubs, gallops RESPIRATORY:  Clear to auscultation without rales, wheezing or rhonchi  ABDOMEN: Soft, non-tender, non-distended MUSCULOSKELETAL:  No edema; No deformity  SKIN: Warm and dry NEUROLOGIC:  Alert and oriented x 3 PSYCHIATRIC:  Normal affect    Signed, Shirlee More, MD  08/11/2017 11:46 AM    [place]

## 2017-08-11 ENCOUNTER — Encounter: Payer: Self-pay | Admitting: Cardiology

## 2017-08-11 ENCOUNTER — Ambulatory Visit: Payer: Medicare Other | Admitting: Cardiology

## 2017-08-11 VITALS — BP 120/80 | HR 54 | Ht 75.0 in | Wt 262.0 lb

## 2017-08-11 DIAGNOSIS — I2584 Coronary atherosclerosis due to calcified coronary lesion: Secondary | ICD-10-CM

## 2017-08-11 DIAGNOSIS — I2510 Atherosclerotic heart disease of native coronary artery without angina pectoris: Secondary | ICD-10-CM | POA: Diagnosis not present

## 2017-08-11 DIAGNOSIS — I288 Other diseases of pulmonary vessels: Secondary | ICD-10-CM | POA: Diagnosis not present

## 2017-08-11 NOTE — Patient Instructions (Signed)

## 2017-08-29 DIAGNOSIS — Z23 Encounter for immunization: Secondary | ICD-10-CM | POA: Diagnosis not present

## 2017-11-07 IMAGING — NM NM MISC PROCEDURE
6 series · 36 of 36 positions shown · non-contrast
Comparison: none

[Series 1: wbr_s-proj_st stress-gsp · 6.40mm/px · 6 of 512 frames shown]
[frame 43/512]
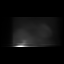
[frame 128/512]
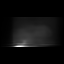
[frame 214/512]
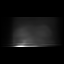
[frame 299/512]
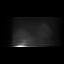
[frame 384/512]
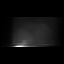
[frame 470/512]
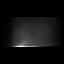

[Series 1: wbr_s-proj_st stress-sum-em · 6.40mm/px · 6 of 64 frames shown]
[frame 6/64]
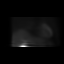
[frame 16/64]
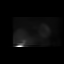
[frame 27/64]
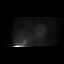
[frame 38/64]
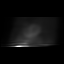
[frame 48/64]
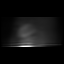
[frame 59/64]
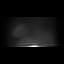

[Series 1: rest · 6.40mm/px · 6 of 64 frames shown]
[frame 6/64]
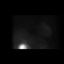
[frame 16/64]
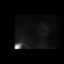
[frame 27/64]
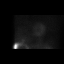
[frame 38/64]
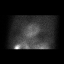
[frame 48/64]
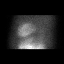
[frame 59/64]
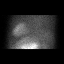

[Series 1: stress-gsp · 6.40mm/px · 6 of 512 frames shown]
[frame 43/512]
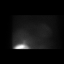
[frame 128/512]
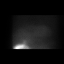
[frame 214/512]
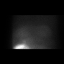
[frame 299/512]
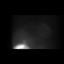
[frame 384/512]
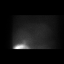
[frame 470/512]
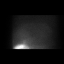

[Series 1: stress-sum-em · 6.40mm/px · 6 of 64 frames shown]
[frame 6/64]
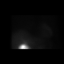
[frame 16/64]
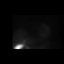
[frame 27/64]
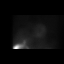
[frame 38/64]
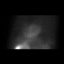
[frame 48/64]
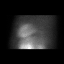
[frame 59/64]
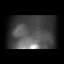

[Series 1: wbr_r-proj_st rest · 6.40mm/px · 6 of 64 frames shown]
[frame 6/64]
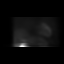
[frame 16/64]
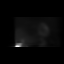
[frame 27/64]
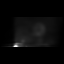
[frame 38/64]
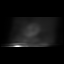
[frame 48/64]
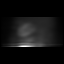
[frame 59/64]
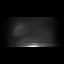

[36 of 36 positions shown; findings below may reference images not displayed]

Canned report from images found in remote index.

Refer to host system for actual result text.

## 2017-12-10 DIAGNOSIS — R69 Illness, unspecified: Secondary | ICD-10-CM | POA: Diagnosis not present

## 2018-02-18 DIAGNOSIS — R69 Illness, unspecified: Secondary | ICD-10-CM | POA: Diagnosis not present

## 2018-02-18 DIAGNOSIS — E785 Hyperlipidemia, unspecified: Secondary | ICD-10-CM | POA: Diagnosis not present

## 2018-02-18 DIAGNOSIS — Z87891 Personal history of nicotine dependence: Secondary | ICD-10-CM | POA: Diagnosis not present

## 2018-02-18 DIAGNOSIS — Z823 Family history of stroke: Secondary | ICD-10-CM | POA: Diagnosis not present

## 2018-02-18 DIAGNOSIS — N529 Male erectile dysfunction, unspecified: Secondary | ICD-10-CM | POA: Diagnosis not present

## 2018-02-18 DIAGNOSIS — Z6833 Body mass index (BMI) 33.0-33.9, adult: Secondary | ICD-10-CM | POA: Diagnosis not present

## 2018-02-18 DIAGNOSIS — E669 Obesity, unspecified: Secondary | ICD-10-CM | POA: Diagnosis not present

## 2018-02-18 DIAGNOSIS — J309 Allergic rhinitis, unspecified: Secondary | ICD-10-CM | POA: Diagnosis not present

## 2018-02-18 DIAGNOSIS — Z7982 Long term (current) use of aspirin: Secondary | ICD-10-CM | POA: Diagnosis not present

## 2018-02-25 DIAGNOSIS — Z9841 Cataract extraction status, right eye: Secondary | ICD-10-CM | POA: Diagnosis not present

## 2018-02-25 DIAGNOSIS — H40013 Open angle with borderline findings, low risk, bilateral: Secondary | ICD-10-CM | POA: Diagnosis not present

## 2018-02-25 DIAGNOSIS — H47233 Glaucomatous optic atrophy, bilateral: Secondary | ICD-10-CM | POA: Diagnosis not present

## 2018-02-25 DIAGNOSIS — Z961 Presence of intraocular lens: Secondary | ICD-10-CM | POA: Diagnosis not present

## 2018-02-25 DIAGNOSIS — Z9842 Cataract extraction status, left eye: Secondary | ICD-10-CM | POA: Diagnosis not present

## 2018-04-30 ENCOUNTER — Telehealth: Payer: Self-pay | Admitting: Sports Medicine

## 2018-04-30 NOTE — Telephone Encounter (Signed)
Patients wife Casey Webster called to see when they will get Casey Webster results back

## 2018-05-01 NOTE — Telephone Encounter (Signed)
[facility] states she will fax the NCV with EMG results to our office.

## 2018-05-01 NOTE — Telephone Encounter (Signed)
I informed pt we had not seen him since 2018 and I did not see orders after that time period. Pt states he is calling for a copy of his nerve test. I told pt I would have the results sent to his home address.

## 2018-05-01 NOTE — Telephone Encounter (Signed)
Mailed results to pt. Informed pt of the call and receipt of results after call on 05/01/2018.

## 2018-05-06 ENCOUNTER — Other Ambulatory Visit: Payer: Self-pay

## 2018-05-06 ENCOUNTER — Telehealth: Payer: Self-pay

## 2018-05-06 DIAGNOSIS — M792 Neuralgia and neuritis, unspecified: Secondary | ICD-10-CM

## 2018-05-06 NOTE — Telephone Encounter (Signed)
Spoke with patient informing him of nerve conduction results.  Per Dr. Cannon Kettle patient is to follow-up with neurologist for consult for further pain management of neuropathy.  Referral to [facility] for consult faxed to [phone].

## 2018-05-11 DIAGNOSIS — M216X1 Other acquired deformities of right foot: Secondary | ICD-10-CM | POA: Diagnosis not present

## 2018-05-11 DIAGNOSIS — M7741 Metatarsalgia, right foot: Secondary | ICD-10-CM | POA: Diagnosis not present

## 2018-05-11 DIAGNOSIS — M2041 Other hammer toe(s) (acquired), right foot: Secondary | ICD-10-CM | POA: Diagnosis not present

## 2018-05-11 DIAGNOSIS — M79671 Pain in right foot: Secondary | ICD-10-CM | POA: Diagnosis not present

## 2018-05-11 DIAGNOSIS — M6701 Short Achilles tendon (acquired), right ankle: Secondary | ICD-10-CM | POA: Diagnosis not present

## 2018-05-11 DIAGNOSIS — M205X9 Other deformities of toe(s) (acquired), unspecified foot: Secondary | ICD-10-CM | POA: Diagnosis not present

## 2018-05-11 DIAGNOSIS — M79672 Pain in left foot: Secondary | ICD-10-CM | POA: Diagnosis not present

## 2018-05-12 ENCOUNTER — Telehealth: Payer: Self-pay | Admitting: *Deleted

## 2018-05-12 NOTE — Telephone Encounter (Signed)
Dr. Cannon Kettle referred pt to [facility] for abnormal nerve test results. Faxed orders to [facility].

## 2018-05-18 DIAGNOSIS — Z1331 Encounter for screening for depression: Secondary | ICD-10-CM | POA: Diagnosis not present

## 2018-05-18 DIAGNOSIS — Z125 Encounter for screening for malignant neoplasm of prostate: Secondary | ICD-10-CM | POA: Diagnosis not present

## 2018-05-18 DIAGNOSIS — Z13220 Encounter for screening for lipoid disorders: Secondary | ICD-10-CM | POA: Diagnosis not present

## 2018-05-18 DIAGNOSIS — Z0000 Encounter for general adult medical examination without abnormal findings: Secondary | ICD-10-CM | POA: Diagnosis not present

## 2018-05-18 DIAGNOSIS — Z1339 Encounter for screening examination for other mental health and behavioral disorders: Secondary | ICD-10-CM | POA: Diagnosis not present

## 2018-05-18 DIAGNOSIS — E785 Hyperlipidemia, unspecified: Secondary | ICD-10-CM | POA: Diagnosis not present

## 2018-05-18 DIAGNOSIS — Z6834 Body mass index (BMI) 34.0-34.9, adult: Secondary | ICD-10-CM | POA: Diagnosis not present

## 2018-06-05 DIAGNOSIS — R69 Illness, unspecified: Secondary | ICD-10-CM | POA: Diagnosis not present

## 2018-06-23 DIAGNOSIS — M7741 Metatarsalgia, right foot: Secondary | ICD-10-CM | POA: Diagnosis not present

## 2018-06-23 DIAGNOSIS — M205X1 Other deformities of toe(s) (acquired), right foot: Secondary | ICD-10-CM | POA: Diagnosis not present

## 2018-06-23 DIAGNOSIS — M2041 Other hammer toe(s) (acquired), right foot: Secondary | ICD-10-CM | POA: Diagnosis not present

## 2018-06-23 DIAGNOSIS — M6701 Short Achilles tendon (acquired), right ankle: Secondary | ICD-10-CM | POA: Diagnosis not present

## 2018-06-23 DIAGNOSIS — M21531 Acquired clawfoot, right foot: Secondary | ICD-10-CM | POA: Diagnosis not present

## 2018-06-23 DIAGNOSIS — G8918 Other acute postprocedural pain: Secondary | ICD-10-CM | POA: Diagnosis not present

## 2018-08-06 DIAGNOSIS — M79671 Pain in right foot: Secondary | ICD-10-CM | POA: Diagnosis not present

## 2018-08-06 DIAGNOSIS — M7741 Metatarsalgia, right foot: Secondary | ICD-10-CM | POA: Diagnosis not present

## 2018-08-06 DIAGNOSIS — M6701 Short Achilles tendon (acquired), right ankle: Secondary | ICD-10-CM | POA: Diagnosis not present

## 2018-08-06 DIAGNOSIS — M2041 Other hammer toe(s) (acquired), right foot: Secondary | ICD-10-CM | POA: Diagnosis not present

## 2018-08-06 DIAGNOSIS — Z5189 Encounter for other specified aftercare: Secondary | ICD-10-CM | POA: Diagnosis not present

## 2018-08-06 DIAGNOSIS — M205X9 Other deformities of toe(s) (acquired), unspecified foot: Secondary | ICD-10-CM | POA: Diagnosis not present

## 2018-09-07 DIAGNOSIS — H47233 Glaucomatous optic atrophy, bilateral: Secondary | ICD-10-CM | POA: Diagnosis not present

## 2018-09-07 DIAGNOSIS — H40013 Open angle with borderline findings, low risk, bilateral: Secondary | ICD-10-CM | POA: Diagnosis not present

## 2018-09-09 DIAGNOSIS — M7741 Metatarsalgia, right foot: Secondary | ICD-10-CM | POA: Diagnosis not present

## 2018-09-09 DIAGNOSIS — M6701 Short Achilles tendon (acquired), right ankle: Secondary | ICD-10-CM | POA: Diagnosis not present

## 2018-09-09 DIAGNOSIS — M2041 Other hammer toe(s) (acquired), right foot: Secondary | ICD-10-CM | POA: Diagnosis not present

## 2018-09-09 DIAGNOSIS — I700 Atherosclerosis of aorta: Secondary | ICD-10-CM | POA: Diagnosis not present

## 2018-09-09 DIAGNOSIS — I2510 Atherosclerotic heart disease of native coronary artery without angina pectoris: Secondary | ICD-10-CM | POA: Diagnosis not present

## 2018-09-09 DIAGNOSIS — Z5189 Encounter for other specified aftercare: Secondary | ICD-10-CM | POA: Diagnosis not present

## 2018-09-09 DIAGNOSIS — Z87891 Personal history of nicotine dependence: Secondary | ICD-10-CM | POA: Diagnosis not present

## 2018-09-09 DIAGNOSIS — J439 Emphysema, unspecified: Secondary | ICD-10-CM | POA: Diagnosis not present

## 2018-09-09 DIAGNOSIS — M205X9 Other deformities of toe(s) (acquired), unspecified foot: Secondary | ICD-10-CM | POA: Diagnosis not present

## 2018-09-09 DIAGNOSIS — M79671 Pain in right foot: Secondary | ICD-10-CM | POA: Diagnosis not present

## 2018-09-10 DIAGNOSIS — Z6836 Body mass index (BMI) 36.0-36.9, adult: Secondary | ICD-10-CM | POA: Diagnosis not present

## 2018-09-10 DIAGNOSIS — J189 Pneumonia, unspecified organism: Secondary | ICD-10-CM | POA: Diagnosis not present

## 2018-09-10 DIAGNOSIS — J9801 Acute bronchospasm: Secondary | ICD-10-CM | POA: Diagnosis not present

## 2019-02-17 DIAGNOSIS — Z87891 Personal history of nicotine dependence: Secondary | ICD-10-CM | POA: Diagnosis not present

## 2019-02-19 DIAGNOSIS — Z298 Encounter for other specified prophylactic measures: Secondary | ICD-10-CM | POA: Diagnosis not present

## 2019-03-03 DIAGNOSIS — J439 Emphysema, unspecified: Secondary | ICD-10-CM | POA: Diagnosis not present

## 2019-03-03 DIAGNOSIS — I2510 Atherosclerotic heart disease of native coronary artery without angina pectoris: Secondary | ICD-10-CM | POA: Diagnosis not present

## 2019-03-03 DIAGNOSIS — I700 Atherosclerosis of aorta: Secondary | ICD-10-CM | POA: Diagnosis not present

## 2019-03-03 DIAGNOSIS — K449 Diaphragmatic hernia without obstruction or gangrene: Secondary | ICD-10-CM | POA: Diagnosis not present

## 2019-03-03 DIAGNOSIS — Z87891 Personal history of nicotine dependence: Secondary | ICD-10-CM | POA: Diagnosis not present

## 2019-03-12 DIAGNOSIS — J309 Allergic rhinitis, unspecified: Secondary | ICD-10-CM | POA: Diagnosis not present

## 2019-03-12 DIAGNOSIS — Z833 Family history of diabetes mellitus: Secondary | ICD-10-CM | POA: Diagnosis not present

## 2019-03-12 DIAGNOSIS — Z87891 Personal history of nicotine dependence: Secondary | ICD-10-CM | POA: Diagnosis not present

## 2019-03-12 DIAGNOSIS — Z7722 Contact with and (suspected) exposure to environmental tobacco smoke (acute) (chronic): Secondary | ICD-10-CM | POA: Diagnosis not present

## 2019-03-12 DIAGNOSIS — Z823 Family history of stroke: Secondary | ICD-10-CM | POA: Diagnosis not present

## 2019-04-07 DIAGNOSIS — D485 Neoplasm of uncertain behavior of skin: Secondary | ICD-10-CM | POA: Diagnosis not present

## 2019-04-07 DIAGNOSIS — L820 Inflamed seborrheic keratosis: Secondary | ICD-10-CM | POA: Diagnosis not present

## 2019-04-07 DIAGNOSIS — L821 Other seborrheic keratosis: Secondary | ICD-10-CM | POA: Diagnosis not present

## 2019-04-07 DIAGNOSIS — D225 Melanocytic nevi of trunk: Secondary | ICD-10-CM | POA: Diagnosis not present

## 2019-05-10 DIAGNOSIS — R69 Illness, unspecified: Secondary | ICD-10-CM | POA: Diagnosis not present

## 2019-07-05 DIAGNOSIS — R69 Illness, unspecified: Secondary | ICD-10-CM | POA: Diagnosis not present

## 2019-07-06 DIAGNOSIS — H5203 Hypermetropia, bilateral: Secondary | ICD-10-CM | POA: Diagnosis not present

## 2019-07-06 DIAGNOSIS — H47233 Glaucomatous optic atrophy, bilateral: Secondary | ICD-10-CM | POA: Diagnosis not present

## 2019-07-06 DIAGNOSIS — H52223 Regular astigmatism, bilateral: Secondary | ICD-10-CM | POA: Diagnosis not present

## 2019-07-06 DIAGNOSIS — H40013 Open angle with borderline findings, low risk, bilateral: Secondary | ICD-10-CM | POA: Diagnosis not present

## 2019-07-06 DIAGNOSIS — H40003 Preglaucoma, unspecified, bilateral: Secondary | ICD-10-CM | POA: Diagnosis not present

## 2019-07-06 DIAGNOSIS — H524 Presbyopia: Secondary | ICD-10-CM | POA: Diagnosis not present

## 2019-07-20 ENCOUNTER — Telehealth: Payer: Self-pay | Admitting: Gastroenterology

## 2019-07-20 DIAGNOSIS — Z9181 History of falling: Secondary | ICD-10-CM | POA: Diagnosis not present

## 2019-07-20 DIAGNOSIS — Z0000 Encounter for general adult medical examination without abnormal findings: Secondary | ICD-10-CM | POA: Diagnosis not present

## 2019-07-20 DIAGNOSIS — Z1331 Encounter for screening for depression: Secondary | ICD-10-CM | POA: Diagnosis not present

## 2019-07-20 DIAGNOSIS — Z79899 Other long term (current) drug therapy: Secondary | ICD-10-CM | POA: Diagnosis not present

## 2019-07-20 DIAGNOSIS — Z23 Encounter for immunization: Secondary | ICD-10-CM | POA: Diagnosis not present

## 2019-07-20 DIAGNOSIS — T148XXA Other injury of unspecified body region, initial encounter: Secondary | ICD-10-CM | POA: Diagnosis not present

## 2019-07-20 DIAGNOSIS — Z6835 Body mass index (BMI) 35.0-35.9, adult: Secondary | ICD-10-CM | POA: Diagnosis not present

## 2019-07-20 DIAGNOSIS — D519 Vitamin B12 deficiency anemia, unspecified: Secondary | ICD-10-CM | POA: Diagnosis not present

## 2019-07-20 DIAGNOSIS — E785 Hyperlipidemia, unspecified: Secondary | ICD-10-CM | POA: Diagnosis not present

## 2019-07-20 NOTE — Telephone Encounter (Signed)
Heidi from [facility] needs to know when pt is due for a colon. Pls call her at [phone], ext [phone].

## 2019-07-20 NOTE — Telephone Encounter (Signed)
Please review previous message and advise 

## 2019-07-20 NOTE — Telephone Encounter (Signed)
Trisha Can you print out the last OV/colon report/biopsies from our old system and let me know. Thanks RG  

## 2019-07-21 NOTE — Telephone Encounter (Signed)
I have put this on your desk for review.  

## 2019-07-22 NOTE — Telephone Encounter (Signed)
Colonoscopy 04/2016-poor preparation ([facility])-colonic polyps s/p polypectomy, pancolonic diverticulosis. Bx-hyperplastic but poor prep  Plan: -Repeat colonoscopy after 2-day prep  Pl inform him.  Thx  RG

## 2019-08-02 NOTE — Telephone Encounter (Signed)
Patient said that he is going to start to see Dr Lyda Jester and asked for records to be sent to them

## 2019-08-02 NOTE — Telephone Encounter (Signed)
Faxed over last ov, colon report with pathology, ultrasound and CT scan to Dr Lyda Jester office

## 2019-08-11 DIAGNOSIS — K219 Gastro-esophageal reflux disease without esophagitis: Secondary | ICD-10-CM | POA: Diagnosis not present

## 2019-10-12 DIAGNOSIS — H47233 Glaucomatous optic atrophy, bilateral: Secondary | ICD-10-CM | POA: Diagnosis not present

## 2019-10-12 DIAGNOSIS — H40013 Open angle with borderline findings, low risk, bilateral: Secondary | ICD-10-CM | POA: Diagnosis not present

## 2019-11-09 DIAGNOSIS — Z471 Aftercare following joint replacement surgery: Secondary | ICD-10-CM | POA: Diagnosis not present

## 2019-11-09 DIAGNOSIS — M5136 Other intervertebral disc degeneration, lumbar region: Secondary | ICD-10-CM | POA: Diagnosis not present

## 2019-11-09 DIAGNOSIS — Z96651 Presence of right artificial knee joint: Secondary | ICD-10-CM | POA: Diagnosis not present

## 2019-11-12 DIAGNOSIS — M79651 Pain in right thigh: Secondary | ICD-10-CM | POA: Diagnosis not present

## 2019-11-12 DIAGNOSIS — R2689 Other abnormalities of gait and mobility: Secondary | ICD-10-CM | POA: Diagnosis not present

## 2019-11-12 DIAGNOSIS — Z96651 Presence of right artificial knee joint: Secondary | ICD-10-CM | POA: Diagnosis not present

## 2019-11-12 DIAGNOSIS — M545 Low back pain: Secondary | ICD-10-CM | POA: Diagnosis not present

## 2019-11-15 DIAGNOSIS — R69 Illness, unspecified: Secondary | ICD-10-CM | POA: Diagnosis not present

## 2019-11-23 DIAGNOSIS — M545 Low back pain: Secondary | ICD-10-CM | POA: Diagnosis not present

## 2019-11-23 DIAGNOSIS — M79651 Pain in right thigh: Secondary | ICD-10-CM | POA: Diagnosis not present

## 2019-11-23 DIAGNOSIS — R2689 Other abnormalities of gait and mobility: Secondary | ICD-10-CM | POA: Diagnosis not present

## 2019-11-23 DIAGNOSIS — Z96651 Presence of right artificial knee joint: Secondary | ICD-10-CM | POA: Diagnosis not present

## 2019-12-16 DIAGNOSIS — M5136 Other intervertebral disc degeneration, lumbar region: Secondary | ICD-10-CM | POA: Diagnosis not present

## 2019-12-16 DIAGNOSIS — M5137 Other intervertebral disc degeneration, lumbosacral region: Secondary | ICD-10-CM | POA: Diagnosis not present

## 2019-12-16 DIAGNOSIS — M62830 Muscle spasm of back: Secondary | ICD-10-CM | POA: Diagnosis not present

## 2019-12-16 DIAGNOSIS — M5442 Lumbago with sciatica, left side: Secondary | ICD-10-CM | POA: Diagnosis not present

## 2019-12-16 DIAGNOSIS — M9903 Segmental and somatic dysfunction of lumbar region: Secondary | ICD-10-CM | POA: Diagnosis not present

## 2019-12-16 DIAGNOSIS — M9904 Segmental and somatic dysfunction of sacral region: Secondary | ICD-10-CM | POA: Diagnosis not present

## 2019-12-20 DIAGNOSIS — M5442 Lumbago with sciatica, left side: Secondary | ICD-10-CM | POA: Diagnosis not present

## 2019-12-20 DIAGNOSIS — M5137 Other intervertebral disc degeneration, lumbosacral region: Secondary | ICD-10-CM | POA: Diagnosis not present

## 2019-12-20 DIAGNOSIS — M62830 Muscle spasm of back: Secondary | ICD-10-CM | POA: Diagnosis not present

## 2019-12-20 DIAGNOSIS — M5136 Other intervertebral disc degeneration, lumbar region: Secondary | ICD-10-CM | POA: Diagnosis not present

## 2019-12-20 DIAGNOSIS — M9904 Segmental and somatic dysfunction of sacral region: Secondary | ICD-10-CM | POA: Diagnosis not present

## 2019-12-20 DIAGNOSIS — M9903 Segmental and somatic dysfunction of lumbar region: Secondary | ICD-10-CM | POA: Diagnosis not present

## 2019-12-21 DIAGNOSIS — M5137 Other intervertebral disc degeneration, lumbosacral region: Secondary | ICD-10-CM | POA: Diagnosis not present

## 2019-12-21 DIAGNOSIS — M9903 Segmental and somatic dysfunction of lumbar region: Secondary | ICD-10-CM | POA: Diagnosis not present

## 2019-12-21 DIAGNOSIS — M5136 Other intervertebral disc degeneration, lumbar region: Secondary | ICD-10-CM | POA: Diagnosis not present

## 2019-12-21 DIAGNOSIS — M62830 Muscle spasm of back: Secondary | ICD-10-CM | POA: Diagnosis not present

## 2019-12-21 DIAGNOSIS — M9904 Segmental and somatic dysfunction of sacral region: Secondary | ICD-10-CM | POA: Diagnosis not present

## 2019-12-21 DIAGNOSIS — M5442 Lumbago with sciatica, left side: Secondary | ICD-10-CM | POA: Diagnosis not present

## 2019-12-22 DIAGNOSIS — M5136 Other intervertebral disc degeneration, lumbar region: Secondary | ICD-10-CM | POA: Diagnosis not present

## 2019-12-22 DIAGNOSIS — M5442 Lumbago with sciatica, left side: Secondary | ICD-10-CM | POA: Diagnosis not present

## 2019-12-22 DIAGNOSIS — M9904 Segmental and somatic dysfunction of sacral region: Secondary | ICD-10-CM | POA: Diagnosis not present

## 2019-12-22 DIAGNOSIS — M5137 Other intervertebral disc degeneration, lumbosacral region: Secondary | ICD-10-CM | POA: Diagnosis not present

## 2019-12-22 DIAGNOSIS — M62830 Muscle spasm of back: Secondary | ICD-10-CM | POA: Diagnosis not present

## 2019-12-22 DIAGNOSIS — M9903 Segmental and somatic dysfunction of lumbar region: Secondary | ICD-10-CM | POA: Diagnosis not present

## 2019-12-27 DIAGNOSIS — M62830 Muscle spasm of back: Secondary | ICD-10-CM | POA: Diagnosis not present

## 2019-12-27 DIAGNOSIS — M5136 Other intervertebral disc degeneration, lumbar region: Secondary | ICD-10-CM | POA: Diagnosis not present

## 2019-12-27 DIAGNOSIS — M5137 Other intervertebral disc degeneration, lumbosacral region: Secondary | ICD-10-CM | POA: Diagnosis not present

## 2019-12-27 DIAGNOSIS — M5442 Lumbago with sciatica, left side: Secondary | ICD-10-CM | POA: Diagnosis not present

## 2019-12-27 DIAGNOSIS — M9904 Segmental and somatic dysfunction of sacral region: Secondary | ICD-10-CM | POA: Diagnosis not present

## 2019-12-27 DIAGNOSIS — M9903 Segmental and somatic dysfunction of lumbar region: Secondary | ICD-10-CM | POA: Diagnosis not present

## 2019-12-28 DIAGNOSIS — M9903 Segmental and somatic dysfunction of lumbar region: Secondary | ICD-10-CM | POA: Diagnosis not present

## 2019-12-28 DIAGNOSIS — M5136 Other intervertebral disc degeneration, lumbar region: Secondary | ICD-10-CM | POA: Diagnosis not present

## 2019-12-28 DIAGNOSIS — M9904 Segmental and somatic dysfunction of sacral region: Secondary | ICD-10-CM | POA: Diagnosis not present

## 2019-12-28 DIAGNOSIS — M62830 Muscle spasm of back: Secondary | ICD-10-CM | POA: Diagnosis not present

## 2019-12-28 DIAGNOSIS — M5442 Lumbago with sciatica, left side: Secondary | ICD-10-CM | POA: Diagnosis not present

## 2019-12-28 DIAGNOSIS — M5137 Other intervertebral disc degeneration, lumbosacral region: Secondary | ICD-10-CM | POA: Diagnosis not present

## 2019-12-30 DIAGNOSIS — M5136 Other intervertebral disc degeneration, lumbar region: Secondary | ICD-10-CM | POA: Diagnosis not present

## 2019-12-30 DIAGNOSIS — M62830 Muscle spasm of back: Secondary | ICD-10-CM | POA: Diagnosis not present

## 2019-12-30 DIAGNOSIS — M9904 Segmental and somatic dysfunction of sacral region: Secondary | ICD-10-CM | POA: Diagnosis not present

## 2019-12-30 DIAGNOSIS — M5137 Other intervertebral disc degeneration, lumbosacral region: Secondary | ICD-10-CM | POA: Diagnosis not present

## 2019-12-30 DIAGNOSIS — M5442 Lumbago with sciatica, left side: Secondary | ICD-10-CM | POA: Diagnosis not present

## 2019-12-30 DIAGNOSIS — M9903 Segmental and somatic dysfunction of lumbar region: Secondary | ICD-10-CM | POA: Diagnosis not present

## 2020-01-03 DIAGNOSIS — M5442 Lumbago with sciatica, left side: Secondary | ICD-10-CM | POA: Diagnosis not present

## 2020-01-03 DIAGNOSIS — M5137 Other intervertebral disc degeneration, lumbosacral region: Secondary | ICD-10-CM | POA: Diagnosis not present

## 2020-01-03 DIAGNOSIS — M9904 Segmental and somatic dysfunction of sacral region: Secondary | ICD-10-CM | POA: Diagnosis not present

## 2020-01-03 DIAGNOSIS — M9903 Segmental and somatic dysfunction of lumbar region: Secondary | ICD-10-CM | POA: Diagnosis not present

## 2020-01-03 DIAGNOSIS — M62830 Muscle spasm of back: Secondary | ICD-10-CM | POA: Diagnosis not present

## 2020-01-03 DIAGNOSIS — M5136 Other intervertebral disc degeneration, lumbar region: Secondary | ICD-10-CM | POA: Diagnosis not present

## 2020-01-04 DIAGNOSIS — M9903 Segmental and somatic dysfunction of lumbar region: Secondary | ICD-10-CM | POA: Diagnosis not present

## 2020-01-04 DIAGNOSIS — M5136 Other intervertebral disc degeneration, lumbar region: Secondary | ICD-10-CM | POA: Diagnosis not present

## 2020-01-04 DIAGNOSIS — M5442 Lumbago with sciatica, left side: Secondary | ICD-10-CM | POA: Diagnosis not present

## 2020-01-04 DIAGNOSIS — M62830 Muscle spasm of back: Secondary | ICD-10-CM | POA: Diagnosis not present

## 2020-01-04 DIAGNOSIS — M5137 Other intervertebral disc degeneration, lumbosacral region: Secondary | ICD-10-CM | POA: Diagnosis not present

## 2020-01-04 DIAGNOSIS — M9904 Segmental and somatic dysfunction of sacral region: Secondary | ICD-10-CM | POA: Diagnosis not present

## 2020-01-05 DIAGNOSIS — M62830 Muscle spasm of back: Secondary | ICD-10-CM | POA: Diagnosis not present

## 2020-01-05 DIAGNOSIS — M5136 Other intervertebral disc degeneration, lumbar region: Secondary | ICD-10-CM | POA: Diagnosis not present

## 2020-01-05 DIAGNOSIS — M5137 Other intervertebral disc degeneration, lumbosacral region: Secondary | ICD-10-CM | POA: Diagnosis not present

## 2020-01-05 DIAGNOSIS — M9904 Segmental and somatic dysfunction of sacral region: Secondary | ICD-10-CM | POA: Diagnosis not present

## 2020-01-05 DIAGNOSIS — M9903 Segmental and somatic dysfunction of lumbar region: Secondary | ICD-10-CM | POA: Diagnosis not present

## 2020-01-05 DIAGNOSIS — M5442 Lumbago with sciatica, left side: Secondary | ICD-10-CM | POA: Diagnosis not present

## 2020-01-19 DIAGNOSIS — M9904 Segmental and somatic dysfunction of sacral region: Secondary | ICD-10-CM | POA: Diagnosis not present

## 2020-01-19 DIAGNOSIS — M5136 Other intervertebral disc degeneration, lumbar region: Secondary | ICD-10-CM | POA: Diagnosis not present

## 2020-01-19 DIAGNOSIS — M62830 Muscle spasm of back: Secondary | ICD-10-CM | POA: Diagnosis not present

## 2020-01-19 DIAGNOSIS — M5137 Other intervertebral disc degeneration, lumbosacral region: Secondary | ICD-10-CM | POA: Diagnosis not present

## 2020-01-19 DIAGNOSIS — M5442 Lumbago with sciatica, left side: Secondary | ICD-10-CM | POA: Diagnosis not present

## 2020-01-19 DIAGNOSIS — M9903 Segmental and somatic dysfunction of lumbar region: Secondary | ICD-10-CM | POA: Diagnosis not present

## 2020-01-24 DIAGNOSIS — M5136 Other intervertebral disc degeneration, lumbar region: Secondary | ICD-10-CM | POA: Diagnosis not present

## 2020-01-24 DIAGNOSIS — M5137 Other intervertebral disc degeneration, lumbosacral region: Secondary | ICD-10-CM | POA: Diagnosis not present

## 2020-01-24 DIAGNOSIS — M62830 Muscle spasm of back: Secondary | ICD-10-CM | POA: Diagnosis not present

## 2020-01-24 DIAGNOSIS — M9903 Segmental and somatic dysfunction of lumbar region: Secondary | ICD-10-CM | POA: Diagnosis not present

## 2020-01-24 DIAGNOSIS — M9904 Segmental and somatic dysfunction of sacral region: Secondary | ICD-10-CM | POA: Diagnosis not present

## 2020-01-24 DIAGNOSIS — M5442 Lumbago with sciatica, left side: Secondary | ICD-10-CM | POA: Diagnosis not present

## 2020-01-26 DIAGNOSIS — M62830 Muscle spasm of back: Secondary | ICD-10-CM | POA: Diagnosis not present

## 2020-01-26 DIAGNOSIS — M5137 Other intervertebral disc degeneration, lumbosacral region: Secondary | ICD-10-CM | POA: Diagnosis not present

## 2020-01-26 DIAGNOSIS — M5442 Lumbago with sciatica, left side: Secondary | ICD-10-CM | POA: Diagnosis not present

## 2020-01-26 DIAGNOSIS — M9904 Segmental and somatic dysfunction of sacral region: Secondary | ICD-10-CM | POA: Diagnosis not present

## 2020-01-26 DIAGNOSIS — M5136 Other intervertebral disc degeneration, lumbar region: Secondary | ICD-10-CM | POA: Diagnosis not present

## 2020-01-26 DIAGNOSIS — M9903 Segmental and somatic dysfunction of lumbar region: Secondary | ICD-10-CM | POA: Diagnosis not present

## 2020-01-31 DIAGNOSIS — M5136 Other intervertebral disc degeneration, lumbar region: Secondary | ICD-10-CM | POA: Diagnosis not present

## 2020-01-31 DIAGNOSIS — M62830 Muscle spasm of back: Secondary | ICD-10-CM | POA: Diagnosis not present

## 2020-01-31 DIAGNOSIS — M5137 Other intervertebral disc degeneration, lumbosacral region: Secondary | ICD-10-CM | POA: Diagnosis not present

## 2020-01-31 DIAGNOSIS — M5442 Lumbago with sciatica, left side: Secondary | ICD-10-CM | POA: Diagnosis not present

## 2020-01-31 DIAGNOSIS — M9903 Segmental and somatic dysfunction of lumbar region: Secondary | ICD-10-CM | POA: Diagnosis not present

## 2020-01-31 DIAGNOSIS — M9904 Segmental and somatic dysfunction of sacral region: Secondary | ICD-10-CM | POA: Diagnosis not present

## 2020-02-07 DIAGNOSIS — M62830 Muscle spasm of back: Secondary | ICD-10-CM | POA: Diagnosis not present

## 2020-02-07 DIAGNOSIS — M5137 Other intervertebral disc degeneration, lumbosacral region: Secondary | ICD-10-CM | POA: Diagnosis not present

## 2020-02-07 DIAGNOSIS — M5136 Other intervertebral disc degeneration, lumbar region: Secondary | ICD-10-CM | POA: Diagnosis not present

## 2020-02-07 DIAGNOSIS — M9904 Segmental and somatic dysfunction of sacral region: Secondary | ICD-10-CM | POA: Diagnosis not present

## 2020-02-07 DIAGNOSIS — M5442 Lumbago with sciatica, left side: Secondary | ICD-10-CM | POA: Diagnosis not present

## 2020-02-07 DIAGNOSIS — M9903 Segmental and somatic dysfunction of lumbar region: Secondary | ICD-10-CM | POA: Diagnosis not present

## 2020-02-15 DIAGNOSIS — M5442 Lumbago with sciatica, left side: Secondary | ICD-10-CM | POA: Diagnosis not present

## 2020-02-15 DIAGNOSIS — M5136 Other intervertebral disc degeneration, lumbar region: Secondary | ICD-10-CM | POA: Diagnosis not present

## 2020-02-15 DIAGNOSIS — M5137 Other intervertebral disc degeneration, lumbosacral region: Secondary | ICD-10-CM | POA: Diagnosis not present

## 2020-02-15 DIAGNOSIS — M9903 Segmental and somatic dysfunction of lumbar region: Secondary | ICD-10-CM | POA: Diagnosis not present

## 2020-02-15 DIAGNOSIS — M62830 Muscle spasm of back: Secondary | ICD-10-CM | POA: Diagnosis not present

## 2020-02-15 DIAGNOSIS — M9904 Segmental and somatic dysfunction of sacral region: Secondary | ICD-10-CM | POA: Diagnosis not present

## 2020-02-22 DIAGNOSIS — M9903 Segmental and somatic dysfunction of lumbar region: Secondary | ICD-10-CM | POA: Diagnosis not present

## 2020-02-22 DIAGNOSIS — M9904 Segmental and somatic dysfunction of sacral region: Secondary | ICD-10-CM | POA: Diagnosis not present

## 2020-02-22 DIAGNOSIS — M5137 Other intervertebral disc degeneration, lumbosacral region: Secondary | ICD-10-CM | POA: Diagnosis not present

## 2020-02-22 DIAGNOSIS — M5442 Lumbago with sciatica, left side: Secondary | ICD-10-CM | POA: Diagnosis not present

## 2020-02-22 DIAGNOSIS — M5136 Other intervertebral disc degeneration, lumbar region: Secondary | ICD-10-CM | POA: Diagnosis not present

## 2020-02-22 DIAGNOSIS — M62830 Muscle spasm of back: Secondary | ICD-10-CM | POA: Diagnosis not present

## 2020-02-29 DIAGNOSIS — M62830 Muscle spasm of back: Secondary | ICD-10-CM | POA: Diagnosis not present

## 2020-02-29 DIAGNOSIS — M9903 Segmental and somatic dysfunction of lumbar region: Secondary | ICD-10-CM | POA: Diagnosis not present

## 2020-02-29 DIAGNOSIS — M5137 Other intervertebral disc degeneration, lumbosacral region: Secondary | ICD-10-CM | POA: Diagnosis not present

## 2020-02-29 DIAGNOSIS — M5442 Lumbago with sciatica, left side: Secondary | ICD-10-CM | POA: Diagnosis not present

## 2020-02-29 DIAGNOSIS — M9904 Segmental and somatic dysfunction of sacral region: Secondary | ICD-10-CM | POA: Diagnosis not present

## 2020-02-29 DIAGNOSIS — M5136 Other intervertebral disc degeneration, lumbar region: Secondary | ICD-10-CM | POA: Diagnosis not present

## 2020-03-07 DIAGNOSIS — M5442 Lumbago with sciatica, left side: Secondary | ICD-10-CM | POA: Diagnosis not present

## 2020-03-07 DIAGNOSIS — M5136 Other intervertebral disc degeneration, lumbar region: Secondary | ICD-10-CM | POA: Diagnosis not present

## 2020-03-07 DIAGNOSIS — M9903 Segmental and somatic dysfunction of lumbar region: Secondary | ICD-10-CM | POA: Diagnosis not present

## 2020-03-07 DIAGNOSIS — M62830 Muscle spasm of back: Secondary | ICD-10-CM | POA: Diagnosis not present

## 2020-03-07 DIAGNOSIS — M9904 Segmental and somatic dysfunction of sacral region: Secondary | ICD-10-CM | POA: Diagnosis not present

## 2020-03-07 DIAGNOSIS — M5137 Other intervertebral disc degeneration, lumbosacral region: Secondary | ICD-10-CM | POA: Diagnosis not present

## 2020-04-04 DIAGNOSIS — M5136 Other intervertebral disc degeneration, lumbar region: Secondary | ICD-10-CM | POA: Diagnosis not present

## 2020-04-04 DIAGNOSIS — M9904 Segmental and somatic dysfunction of sacral region: Secondary | ICD-10-CM | POA: Diagnosis not present

## 2020-04-04 DIAGNOSIS — M9903 Segmental and somatic dysfunction of lumbar region: Secondary | ICD-10-CM | POA: Diagnosis not present

## 2020-04-04 DIAGNOSIS — M62830 Muscle spasm of back: Secondary | ICD-10-CM | POA: Diagnosis not present

## 2020-04-04 DIAGNOSIS — M5137 Other intervertebral disc degeneration, lumbosacral region: Secondary | ICD-10-CM | POA: Diagnosis not present

## 2020-04-04 DIAGNOSIS — M5442 Lumbago with sciatica, left side: Secondary | ICD-10-CM | POA: Diagnosis not present

## 2020-05-02 DIAGNOSIS — M5137 Other intervertebral disc degeneration, lumbosacral region: Secondary | ICD-10-CM | POA: Diagnosis not present

## 2020-05-02 DIAGNOSIS — M5136 Other intervertebral disc degeneration, lumbar region: Secondary | ICD-10-CM | POA: Diagnosis not present

## 2020-05-02 DIAGNOSIS — M62830 Muscle spasm of back: Secondary | ICD-10-CM | POA: Diagnosis not present

## 2020-05-02 DIAGNOSIS — M9904 Segmental and somatic dysfunction of sacral region: Secondary | ICD-10-CM | POA: Diagnosis not present

## 2020-05-02 DIAGNOSIS — M5442 Lumbago with sciatica, left side: Secondary | ICD-10-CM | POA: Diagnosis not present

## 2020-05-02 DIAGNOSIS — M9903 Segmental and somatic dysfunction of lumbar region: Secondary | ICD-10-CM | POA: Diagnosis not present

## 2020-05-30 DIAGNOSIS — M5137 Other intervertebral disc degeneration, lumbosacral region: Secondary | ICD-10-CM | POA: Diagnosis not present

## 2020-05-30 DIAGNOSIS — M9904 Segmental and somatic dysfunction of sacral region: Secondary | ICD-10-CM | POA: Diagnosis not present

## 2020-05-30 DIAGNOSIS — M9903 Segmental and somatic dysfunction of lumbar region: Secondary | ICD-10-CM | POA: Diagnosis not present

## 2020-05-30 DIAGNOSIS — M5442 Lumbago with sciatica, left side: Secondary | ICD-10-CM | POA: Diagnosis not present

## 2020-05-30 DIAGNOSIS — M62830 Muscle spasm of back: Secondary | ICD-10-CM | POA: Diagnosis not present

## 2020-05-30 DIAGNOSIS — M5136 Other intervertebral disc degeneration, lumbar region: Secondary | ICD-10-CM | POA: Diagnosis not present

## 2020-05-31 DIAGNOSIS — R69 Illness, unspecified: Secondary | ICD-10-CM | POA: Diagnosis not present

## 2020-06-27 DIAGNOSIS — M5136 Other intervertebral disc degeneration, lumbar region: Secondary | ICD-10-CM | POA: Diagnosis not present

## 2020-06-27 DIAGNOSIS — M9903 Segmental and somatic dysfunction of lumbar region: Secondary | ICD-10-CM | POA: Diagnosis not present

## 2020-06-27 DIAGNOSIS — M9904 Segmental and somatic dysfunction of sacral region: Secondary | ICD-10-CM | POA: Diagnosis not present

## 2020-06-27 DIAGNOSIS — M5442 Lumbago with sciatica, left side: Secondary | ICD-10-CM | POA: Diagnosis not present

## 2020-06-27 DIAGNOSIS — M62830 Muscle spasm of back: Secondary | ICD-10-CM | POA: Diagnosis not present

## 2020-06-27 DIAGNOSIS — M5137 Other intervertebral disc degeneration, lumbosacral region: Secondary | ICD-10-CM | POA: Diagnosis not present

## 2020-07-25 DIAGNOSIS — M9904 Segmental and somatic dysfunction of sacral region: Secondary | ICD-10-CM | POA: Diagnosis not present

## 2020-07-25 DIAGNOSIS — M5442 Lumbago with sciatica, left side: Secondary | ICD-10-CM | POA: Diagnosis not present

## 2020-07-25 DIAGNOSIS — M62830 Muscle spasm of back: Secondary | ICD-10-CM | POA: Diagnosis not present

## 2020-07-25 DIAGNOSIS — M5137 Other intervertebral disc degeneration, lumbosacral region: Secondary | ICD-10-CM | POA: Diagnosis not present

## 2020-07-25 DIAGNOSIS — M5136 Other intervertebral disc degeneration, lumbar region: Secondary | ICD-10-CM | POA: Diagnosis not present

## 2020-07-25 DIAGNOSIS — M9903 Segmental and somatic dysfunction of lumbar region: Secondary | ICD-10-CM | POA: Diagnosis not present

## 2020-07-31 DIAGNOSIS — Z0000 Encounter for general adult medical examination without abnormal findings: Secondary | ICD-10-CM | POA: Diagnosis not present

## 2020-07-31 DIAGNOSIS — Z1331 Encounter for screening for depression: Secondary | ICD-10-CM | POA: Diagnosis not present

## 2020-07-31 DIAGNOSIS — Z79899 Other long term (current) drug therapy: Secondary | ICD-10-CM | POA: Diagnosis not present

## 2020-07-31 DIAGNOSIS — E785 Hyperlipidemia, unspecified: Secondary | ICD-10-CM | POA: Diagnosis not present

## 2020-07-31 DIAGNOSIS — Z6834 Body mass index (BMI) 34.0-34.9, adult: Secondary | ICD-10-CM | POA: Diagnosis not present

## 2020-07-31 DIAGNOSIS — Z23 Encounter for immunization: Secondary | ICD-10-CM | POA: Diagnosis not present

## 2020-08-01 DIAGNOSIS — L820 Inflamed seborrheic keratosis: Secondary | ICD-10-CM | POA: Diagnosis not present

## 2020-08-01 DIAGNOSIS — L219 Seborrheic dermatitis, unspecified: Secondary | ICD-10-CM | POA: Diagnosis not present

## 2020-08-23 DIAGNOSIS — M5136 Other intervertebral disc degeneration, lumbar region: Secondary | ICD-10-CM | POA: Diagnosis not present

## 2020-08-23 DIAGNOSIS — M9904 Segmental and somatic dysfunction of sacral region: Secondary | ICD-10-CM | POA: Diagnosis not present

## 2020-08-23 DIAGNOSIS — M9903 Segmental and somatic dysfunction of lumbar region: Secondary | ICD-10-CM | POA: Diagnosis not present

## 2020-08-23 DIAGNOSIS — M5137 Other intervertebral disc degeneration, lumbosacral region: Secondary | ICD-10-CM | POA: Diagnosis not present

## 2020-08-23 DIAGNOSIS — M5442 Lumbago with sciatica, left side: Secondary | ICD-10-CM | POA: Diagnosis not present

## 2020-08-23 DIAGNOSIS — M62830 Muscle spasm of back: Secondary | ICD-10-CM | POA: Diagnosis not present

## 2020-10-03 DIAGNOSIS — M9903 Segmental and somatic dysfunction of lumbar region: Secondary | ICD-10-CM | POA: Diagnosis not present

## 2020-10-03 DIAGNOSIS — M5136 Other intervertebral disc degeneration, lumbar region: Secondary | ICD-10-CM | POA: Diagnosis not present

## 2020-10-03 DIAGNOSIS — M9904 Segmental and somatic dysfunction of sacral region: Secondary | ICD-10-CM | POA: Diagnosis not present

## 2020-10-03 DIAGNOSIS — M62830 Muscle spasm of back: Secondary | ICD-10-CM | POA: Diagnosis not present

## 2020-10-03 DIAGNOSIS — M5137 Other intervertebral disc degeneration, lumbosacral region: Secondary | ICD-10-CM | POA: Diagnosis not present

## 2020-10-03 DIAGNOSIS — M5442 Lumbago with sciatica, left side: Secondary | ICD-10-CM | POA: Diagnosis not present

## 2020-10-31 DIAGNOSIS — M9903 Segmental and somatic dysfunction of lumbar region: Secondary | ICD-10-CM | POA: Diagnosis not present

## 2020-10-31 DIAGNOSIS — M9904 Segmental and somatic dysfunction of sacral region: Secondary | ICD-10-CM | POA: Diagnosis not present

## 2020-10-31 DIAGNOSIS — M62830 Muscle spasm of back: Secondary | ICD-10-CM | POA: Diagnosis not present

## 2020-10-31 DIAGNOSIS — M5136 Other intervertebral disc degeneration, lumbar region: Secondary | ICD-10-CM | POA: Diagnosis not present

## 2020-10-31 DIAGNOSIS — M5442 Lumbago with sciatica, left side: Secondary | ICD-10-CM | POA: Diagnosis not present

## 2020-10-31 DIAGNOSIS — M5137 Other intervertebral disc degeneration, lumbosacral region: Secondary | ICD-10-CM | POA: Diagnosis not present

## 2020-11-13 DIAGNOSIS — K760 Fatty (change of) liver, not elsewhere classified: Secondary | ICD-10-CM | POA: Diagnosis not present

## 2020-11-20 DIAGNOSIS — K760 Fatty (change of) liver, not elsewhere classified: Secondary | ICD-10-CM | POA: Diagnosis not present

## 2020-11-20 DIAGNOSIS — R935 Abnormal findings on diagnostic imaging of other abdominal regions, including retroperitoneum: Secondary | ICD-10-CM | POA: Diagnosis not present

## 2020-11-20 DIAGNOSIS — R7989 Other specified abnormal findings of blood chemistry: Secondary | ICD-10-CM | POA: Diagnosis not present

## 2020-11-20 DIAGNOSIS — R945 Abnormal results of liver function studies: Secondary | ICD-10-CM | POA: Diagnosis not present

## 2020-11-28 DIAGNOSIS — M9903 Segmental and somatic dysfunction of lumbar region: Secondary | ICD-10-CM | POA: Diagnosis not present

## 2020-11-28 DIAGNOSIS — M9904 Segmental and somatic dysfunction of sacral region: Secondary | ICD-10-CM | POA: Diagnosis not present

## 2020-11-28 DIAGNOSIS — M5136 Other intervertebral disc degeneration, lumbar region: Secondary | ICD-10-CM | POA: Diagnosis not present

## 2020-11-28 DIAGNOSIS — M5137 Other intervertebral disc degeneration, lumbosacral region: Secondary | ICD-10-CM | POA: Diagnosis not present

## 2020-11-28 DIAGNOSIS — M5442 Lumbago with sciatica, left side: Secondary | ICD-10-CM | POA: Diagnosis not present

## 2020-11-28 DIAGNOSIS — M62830 Muscle spasm of back: Secondary | ICD-10-CM | POA: Diagnosis not present

## 2020-12-08 DIAGNOSIS — Z20828 Contact with and (suspected) exposure to other viral communicable diseases: Secondary | ICD-10-CM | POA: Diagnosis not present

## 2021-01-03 DIAGNOSIS — M62830 Muscle spasm of back: Secondary | ICD-10-CM | POA: Diagnosis not present

## 2021-01-03 DIAGNOSIS — M5442 Lumbago with sciatica, left side: Secondary | ICD-10-CM | POA: Diagnosis not present

## 2021-01-03 DIAGNOSIS — M5136 Other intervertebral disc degeneration, lumbar region: Secondary | ICD-10-CM | POA: Diagnosis not present

## 2021-01-03 DIAGNOSIS — M9904 Segmental and somatic dysfunction of sacral region: Secondary | ICD-10-CM | POA: Diagnosis not present

## 2021-01-03 DIAGNOSIS — M9903 Segmental and somatic dysfunction of lumbar region: Secondary | ICD-10-CM | POA: Diagnosis not present

## 2021-01-03 DIAGNOSIS — M5137 Other intervertebral disc degeneration, lumbosacral region: Secondary | ICD-10-CM | POA: Diagnosis not present

## 2021-06-21 DIAGNOSIS — K760 Fatty (change of) liver, not elsewhere classified: Secondary | ICD-10-CM | POA: Diagnosis not present

## 2021-06-21 DIAGNOSIS — K5790 Diverticulosis of intestine, part unspecified, without perforation or abscess without bleeding: Secondary | ICD-10-CM | POA: Diagnosis not present

## 2021-07-03 ENCOUNTER — Encounter: Payer: Self-pay | Admitting: Gastroenterology

## 2021-07-16 DIAGNOSIS — I700 Atherosclerosis of aorta: Secondary | ICD-10-CM | POA: Diagnosis not present

## 2021-07-16 DIAGNOSIS — R030 Elevated blood-pressure reading, without diagnosis of hypertension: Secondary | ICD-10-CM | POA: Diagnosis not present

## 2021-07-16 DIAGNOSIS — E669 Obesity, unspecified: Secondary | ICD-10-CM | POA: Diagnosis not present

## 2021-07-16 DIAGNOSIS — G629 Polyneuropathy, unspecified: Secondary | ICD-10-CM | POA: Diagnosis not present

## 2021-07-16 DIAGNOSIS — Z008 Encounter for other general examination: Secondary | ICD-10-CM | POA: Diagnosis not present

## 2021-07-16 DIAGNOSIS — I2510 Atherosclerotic heart disease of native coronary artery without angina pectoris: Secondary | ICD-10-CM | POA: Diagnosis not present

## 2021-07-16 DIAGNOSIS — E785 Hyperlipidemia, unspecified: Secondary | ICD-10-CM | POA: Diagnosis not present

## 2021-07-16 DIAGNOSIS — J309 Allergic rhinitis, unspecified: Secondary | ICD-10-CM | POA: Diagnosis not present

## 2021-07-16 DIAGNOSIS — Z6834 Body mass index (BMI) 34.0-34.9, adult: Secondary | ICD-10-CM | POA: Diagnosis not present

## 2021-07-16 DIAGNOSIS — M5440 Lumbago with sciatica, unspecified side: Secondary | ICD-10-CM | POA: Diagnosis not present

## 2021-07-16 DIAGNOSIS — N529 Male erectile dysfunction, unspecified: Secondary | ICD-10-CM | POA: Diagnosis not present

## 2021-07-17 DIAGNOSIS — K5730 Diverticulosis of large intestine without perforation or abscess without bleeding: Secondary | ICD-10-CM | POA: Diagnosis not present

## 2021-07-17 DIAGNOSIS — Z1211 Encounter for screening for malignant neoplasm of colon: Secondary | ICD-10-CM | POA: Diagnosis not present

## 2021-07-17 DIAGNOSIS — D122 Benign neoplasm of ascending colon: Secondary | ICD-10-CM | POA: Diagnosis not present

## 2021-07-17 DIAGNOSIS — K644 Residual hemorrhoidal skin tags: Secondary | ICD-10-CM | POA: Diagnosis not present

## 2021-07-17 DIAGNOSIS — Z86010 Personal history of colonic polyps: Secondary | ICD-10-CM | POA: Diagnosis not present

## 2021-07-17 DIAGNOSIS — D126 Benign neoplasm of colon, unspecified: Secondary | ICD-10-CM | POA: Diagnosis not present

## 2021-08-02 DIAGNOSIS — Z79899 Other long term (current) drug therapy: Secondary | ICD-10-CM | POA: Diagnosis not present

## 2021-08-02 DIAGNOSIS — Z23 Encounter for immunization: Secondary | ICD-10-CM | POA: Diagnosis not present

## 2021-08-02 DIAGNOSIS — Z0000 Encounter for general adult medical examination without abnormal findings: Secondary | ICD-10-CM | POA: Diagnosis not present

## 2021-08-02 DIAGNOSIS — Z6834 Body mass index (BMI) 34.0-34.9, adult: Secondary | ICD-10-CM | POA: Diagnosis not present

## 2021-08-02 DIAGNOSIS — E785 Hyperlipidemia, unspecified: Secondary | ICD-10-CM | POA: Diagnosis not present

## 2021-08-02 DIAGNOSIS — Z1331 Encounter for screening for depression: Secondary | ICD-10-CM | POA: Diagnosis not present

## 2021-08-02 DIAGNOSIS — D519 Vitamin B12 deficiency anemia, unspecified: Secondary | ICD-10-CM | POA: Diagnosis not present

## 2021-08-24 DIAGNOSIS — Z87891 Personal history of nicotine dependence: Secondary | ICD-10-CM | POA: Diagnosis not present

## 2022-01-22 DIAGNOSIS — B029 Zoster without complications: Secondary | ICD-10-CM | POA: Diagnosis not present

## 2022-01-22 DIAGNOSIS — L299 Pruritus, unspecified: Secondary | ICD-10-CM | POA: Diagnosis not present

## 2022-02-27 DIAGNOSIS — R911 Solitary pulmonary nodule: Secondary | ICD-10-CM | POA: Diagnosis not present

## 2022-02-27 DIAGNOSIS — I700 Atherosclerosis of aorta: Secondary | ICD-10-CM | POA: Diagnosis not present

## 2022-04-11 DIAGNOSIS — L821 Other seborrheic keratosis: Secondary | ICD-10-CM | POA: Diagnosis not present

## 2022-04-11 DIAGNOSIS — L209 Atopic dermatitis, unspecified: Secondary | ICD-10-CM | POA: Diagnosis not present

## 2022-04-22 DIAGNOSIS — M5137 Other intervertebral disc degeneration, lumbosacral region: Secondary | ICD-10-CM | POA: Diagnosis not present

## 2022-04-22 DIAGNOSIS — M62830 Muscle spasm of back: Secondary | ICD-10-CM | POA: Diagnosis not present

## 2022-04-22 DIAGNOSIS — M9904 Segmental and somatic dysfunction of sacral region: Secondary | ICD-10-CM | POA: Diagnosis not present

## 2022-04-22 DIAGNOSIS — M9903 Segmental and somatic dysfunction of lumbar region: Secondary | ICD-10-CM | POA: Diagnosis not present

## 2022-04-22 DIAGNOSIS — M5442 Lumbago with sciatica, left side: Secondary | ICD-10-CM | POA: Diagnosis not present

## 2022-04-22 DIAGNOSIS — M5136 Other intervertebral disc degeneration, lumbar region: Secondary | ICD-10-CM | POA: Diagnosis not present

## 2022-04-24 DIAGNOSIS — M9904 Segmental and somatic dysfunction of sacral region: Secondary | ICD-10-CM | POA: Diagnosis not present

## 2022-04-24 DIAGNOSIS — M5137 Other intervertebral disc degeneration, lumbosacral region: Secondary | ICD-10-CM | POA: Diagnosis not present

## 2022-04-24 DIAGNOSIS — M5442 Lumbago with sciatica, left side: Secondary | ICD-10-CM | POA: Diagnosis not present

## 2022-04-24 DIAGNOSIS — M62830 Muscle spasm of back: Secondary | ICD-10-CM | POA: Diagnosis not present

## 2022-04-24 DIAGNOSIS — M5136 Other intervertebral disc degeneration, lumbar region: Secondary | ICD-10-CM | POA: Diagnosis not present

## 2022-04-24 DIAGNOSIS — M9903 Segmental and somatic dysfunction of lumbar region: Secondary | ICD-10-CM | POA: Diagnosis not present

## 2022-04-29 DIAGNOSIS — M5442 Lumbago with sciatica, left side: Secondary | ICD-10-CM | POA: Diagnosis not present

## 2022-04-29 DIAGNOSIS — M5136 Other intervertebral disc degeneration, lumbar region: Secondary | ICD-10-CM | POA: Diagnosis not present

## 2022-04-29 DIAGNOSIS — M9904 Segmental and somatic dysfunction of sacral region: Secondary | ICD-10-CM | POA: Diagnosis not present

## 2022-04-29 DIAGNOSIS — M62830 Muscle spasm of back: Secondary | ICD-10-CM | POA: Diagnosis not present

## 2022-04-29 DIAGNOSIS — M5137 Other intervertebral disc degeneration, lumbosacral region: Secondary | ICD-10-CM | POA: Diagnosis not present

## 2022-04-29 DIAGNOSIS — M9903 Segmental and somatic dysfunction of lumbar region: Secondary | ICD-10-CM | POA: Diagnosis not present

## 2022-05-01 DIAGNOSIS — M5136 Other intervertebral disc degeneration, lumbar region: Secondary | ICD-10-CM | POA: Diagnosis not present

## 2022-05-01 DIAGNOSIS — M5137 Other intervertebral disc degeneration, lumbosacral region: Secondary | ICD-10-CM | POA: Diagnosis not present

## 2022-05-01 DIAGNOSIS — M9903 Segmental and somatic dysfunction of lumbar region: Secondary | ICD-10-CM | POA: Diagnosis not present

## 2022-05-01 DIAGNOSIS — M5442 Lumbago with sciatica, left side: Secondary | ICD-10-CM | POA: Diagnosis not present

## 2022-05-01 DIAGNOSIS — M62830 Muscle spasm of back: Secondary | ICD-10-CM | POA: Diagnosis not present

## 2022-05-01 DIAGNOSIS — M9904 Segmental and somatic dysfunction of sacral region: Secondary | ICD-10-CM | POA: Diagnosis not present

## 2022-05-13 DIAGNOSIS — M5442 Lumbago with sciatica, left side: Secondary | ICD-10-CM | POA: Diagnosis not present

## 2022-05-13 DIAGNOSIS — M5136 Other intervertebral disc degeneration, lumbar region: Secondary | ICD-10-CM | POA: Diagnosis not present

## 2022-05-13 DIAGNOSIS — M5137 Other intervertebral disc degeneration, lumbosacral region: Secondary | ICD-10-CM | POA: Diagnosis not present

## 2022-05-13 DIAGNOSIS — M9903 Segmental and somatic dysfunction of lumbar region: Secondary | ICD-10-CM | POA: Diagnosis not present

## 2022-05-13 DIAGNOSIS — M62830 Muscle spasm of back: Secondary | ICD-10-CM | POA: Diagnosis not present

## 2022-05-13 DIAGNOSIS — M9904 Segmental and somatic dysfunction of sacral region: Secondary | ICD-10-CM | POA: Diagnosis not present

## 2022-05-15 DIAGNOSIS — M9904 Segmental and somatic dysfunction of sacral region: Secondary | ICD-10-CM | POA: Diagnosis not present

## 2022-05-15 DIAGNOSIS — M5137 Other intervertebral disc degeneration, lumbosacral region: Secondary | ICD-10-CM | POA: Diagnosis not present

## 2022-05-15 DIAGNOSIS — M5442 Lumbago with sciatica, left side: Secondary | ICD-10-CM | POA: Diagnosis not present

## 2022-05-15 DIAGNOSIS — M5136 Other intervertebral disc degeneration, lumbar region: Secondary | ICD-10-CM | POA: Diagnosis not present

## 2022-05-15 DIAGNOSIS — M9903 Segmental and somatic dysfunction of lumbar region: Secondary | ICD-10-CM | POA: Diagnosis not present

## 2022-05-15 DIAGNOSIS — M62830 Muscle spasm of back: Secondary | ICD-10-CM | POA: Diagnosis not present

## 2022-06-17 DIAGNOSIS — M1990 Unspecified osteoarthritis, unspecified site: Secondary | ICD-10-CM | POA: Diagnosis not present

## 2022-06-17 DIAGNOSIS — E669 Obesity, unspecified: Secondary | ICD-10-CM | POA: Diagnosis not present

## 2022-06-17 DIAGNOSIS — Z8249 Family history of ischemic heart disease and other diseases of the circulatory system: Secondary | ICD-10-CM | POA: Diagnosis not present

## 2022-06-17 DIAGNOSIS — I700 Atherosclerosis of aorta: Secondary | ICD-10-CM | POA: Diagnosis not present

## 2022-06-17 DIAGNOSIS — Z6832 Body mass index (BMI) 32.0-32.9, adult: Secondary | ICD-10-CM | POA: Diagnosis not present

## 2022-06-17 DIAGNOSIS — E785 Hyperlipidemia, unspecified: Secondary | ICD-10-CM | POA: Diagnosis not present

## 2022-06-17 DIAGNOSIS — Z823 Family history of stroke: Secondary | ICD-10-CM | POA: Diagnosis not present

## 2022-06-17 DIAGNOSIS — I2510 Atherosclerotic heart disease of native coronary artery without angina pectoris: Secondary | ICD-10-CM | POA: Diagnosis not present

## 2022-06-17 DIAGNOSIS — Z809 Family history of malignant neoplasm, unspecified: Secondary | ICD-10-CM | POA: Diagnosis not present

## 2022-06-17 DIAGNOSIS — G629 Polyneuropathy, unspecified: Secondary | ICD-10-CM | POA: Diagnosis not present

## 2022-06-17 DIAGNOSIS — Z87891 Personal history of nicotine dependence: Secondary | ICD-10-CM | POA: Diagnosis not present

## 2022-07-15 DIAGNOSIS — Z0100 Encounter for examination of eyes and vision without abnormal findings: Secondary | ICD-10-CM | POA: Diagnosis not present

## 2022-07-24 DIAGNOSIS — L821 Other seborrheic keratosis: Secondary | ICD-10-CM | POA: Diagnosis not present

## 2022-08-09 DIAGNOSIS — Z6835 Body mass index (BMI) 35.0-35.9, adult: Secondary | ICD-10-CM | POA: Diagnosis not present

## 2022-08-09 DIAGNOSIS — Z0000 Encounter for general adult medical examination without abnormal findings: Secondary | ICD-10-CM | POA: Diagnosis not present

## 2022-08-09 DIAGNOSIS — Z1331 Encounter for screening for depression: Secondary | ICD-10-CM | POA: Diagnosis not present

## 2022-08-09 DIAGNOSIS — E669 Obesity, unspecified: Secondary | ICD-10-CM | POA: Diagnosis not present

## 2022-08-09 DIAGNOSIS — Z23 Encounter for immunization: Secondary | ICD-10-CM | POA: Diagnosis not present

## 2022-10-09 DIAGNOSIS — M5137 Other intervertebral disc degeneration, lumbosacral region: Secondary | ICD-10-CM | POA: Diagnosis not present

## 2022-10-09 DIAGNOSIS — M9903 Segmental and somatic dysfunction of lumbar region: Secondary | ICD-10-CM | POA: Diagnosis not present

## 2022-10-09 DIAGNOSIS — M9904 Segmental and somatic dysfunction of sacral region: Secondary | ICD-10-CM | POA: Diagnosis not present

## 2022-10-09 DIAGNOSIS — M5442 Lumbago with sciatica, left side: Secondary | ICD-10-CM | POA: Diagnosis not present

## 2022-10-09 DIAGNOSIS — M62830 Muscle spasm of back: Secondary | ICD-10-CM | POA: Diagnosis not present

## 2022-10-09 DIAGNOSIS — M5136 Other intervertebral disc degeneration, lumbar region: Secondary | ICD-10-CM | POA: Diagnosis not present

## 2022-10-14 DIAGNOSIS — L820 Inflamed seborrheic keratosis: Secondary | ICD-10-CM | POA: Diagnosis not present

## 2022-10-14 DIAGNOSIS — D485 Neoplasm of uncertain behavior of skin: Secondary | ICD-10-CM | POA: Diagnosis not present

## 2022-10-14 DIAGNOSIS — R748 Abnormal levels of other serum enzymes: Secondary | ICD-10-CM | POA: Diagnosis not present

## 2022-10-14 DIAGNOSIS — L918 Other hypertrophic disorders of the skin: Secondary | ICD-10-CM | POA: Diagnosis not present

## 2022-10-15 DIAGNOSIS — M5137 Other intervertebral disc degeneration, lumbosacral region: Secondary | ICD-10-CM | POA: Diagnosis not present

## 2022-10-15 DIAGNOSIS — M62830 Muscle spasm of back: Secondary | ICD-10-CM | POA: Diagnosis not present

## 2022-10-15 DIAGNOSIS — M5442 Lumbago with sciatica, left side: Secondary | ICD-10-CM | POA: Diagnosis not present

## 2022-10-15 DIAGNOSIS — M5136 Other intervertebral disc degeneration, lumbar region: Secondary | ICD-10-CM | POA: Diagnosis not present

## 2022-10-15 DIAGNOSIS — M9904 Segmental and somatic dysfunction of sacral region: Secondary | ICD-10-CM | POA: Diagnosis not present

## 2022-10-15 DIAGNOSIS — M9903 Segmental and somatic dysfunction of lumbar region: Secondary | ICD-10-CM | POA: Diagnosis not present

## 2022-10-17 DIAGNOSIS — M5137 Other intervertebral disc degeneration, lumbosacral region: Secondary | ICD-10-CM | POA: Diagnosis not present

## 2022-10-17 DIAGNOSIS — M9904 Segmental and somatic dysfunction of sacral region: Secondary | ICD-10-CM | POA: Diagnosis not present

## 2022-10-17 DIAGNOSIS — M5442 Lumbago with sciatica, left side: Secondary | ICD-10-CM | POA: Diagnosis not present

## 2022-10-17 DIAGNOSIS — M9903 Segmental and somatic dysfunction of lumbar region: Secondary | ICD-10-CM | POA: Diagnosis not present

## 2022-10-17 DIAGNOSIS — M62830 Muscle spasm of back: Secondary | ICD-10-CM | POA: Diagnosis not present

## 2022-10-17 DIAGNOSIS — M5136 Other intervertebral disc degeneration, lumbar region: Secondary | ICD-10-CM | POA: Diagnosis not present

## 2022-10-22 DIAGNOSIS — M5136 Other intervertebral disc degeneration, lumbar region: Secondary | ICD-10-CM | POA: Diagnosis not present

## 2022-10-22 DIAGNOSIS — M62830 Muscle spasm of back: Secondary | ICD-10-CM | POA: Diagnosis not present

## 2022-10-22 DIAGNOSIS — M5137 Other intervertebral disc degeneration, lumbosacral region: Secondary | ICD-10-CM | POA: Diagnosis not present

## 2022-10-22 DIAGNOSIS — M5442 Lumbago with sciatica, left side: Secondary | ICD-10-CM | POA: Diagnosis not present

## 2022-10-22 DIAGNOSIS — M25551 Pain in right hip: Secondary | ICD-10-CM | POA: Diagnosis not present

## 2022-10-22 DIAGNOSIS — M9904 Segmental and somatic dysfunction of sacral region: Secondary | ICD-10-CM | POA: Diagnosis not present

## 2022-10-22 DIAGNOSIS — M9903 Segmental and somatic dysfunction of lumbar region: Secondary | ICD-10-CM | POA: Diagnosis not present

## 2022-10-22 DIAGNOSIS — M5431 Sciatica, right side: Secondary | ICD-10-CM | POA: Diagnosis not present

## 2022-10-22 DIAGNOSIS — Z6835 Body mass index (BMI) 35.0-35.9, adult: Secondary | ICD-10-CM | POA: Diagnosis not present

## 2022-10-24 DIAGNOSIS — M5136 Other intervertebral disc degeneration, lumbar region: Secondary | ICD-10-CM | POA: Diagnosis not present

## 2022-10-24 DIAGNOSIS — M9904 Segmental and somatic dysfunction of sacral region: Secondary | ICD-10-CM | POA: Diagnosis not present

## 2022-10-24 DIAGNOSIS — M9903 Segmental and somatic dysfunction of lumbar region: Secondary | ICD-10-CM | POA: Diagnosis not present

## 2022-10-24 DIAGNOSIS — M5442 Lumbago with sciatica, left side: Secondary | ICD-10-CM | POA: Diagnosis not present

## 2022-10-24 DIAGNOSIS — M5137 Other intervertebral disc degeneration, lumbosacral region: Secondary | ICD-10-CM | POA: Diagnosis not present

## 2022-10-24 DIAGNOSIS — M62830 Muscle spasm of back: Secondary | ICD-10-CM | POA: Diagnosis not present

## 2022-10-29 DIAGNOSIS — M5137 Other intervertebral disc degeneration, lumbosacral region: Secondary | ICD-10-CM | POA: Diagnosis not present

## 2022-10-29 DIAGNOSIS — M62830 Muscle spasm of back: Secondary | ICD-10-CM | POA: Diagnosis not present

## 2022-10-29 DIAGNOSIS — M9903 Segmental and somatic dysfunction of lumbar region: Secondary | ICD-10-CM | POA: Diagnosis not present

## 2022-10-29 DIAGNOSIS — M5442 Lumbago with sciatica, left side: Secondary | ICD-10-CM | POA: Diagnosis not present

## 2022-10-29 DIAGNOSIS — M5136 Other intervertebral disc degeneration, lumbar region: Secondary | ICD-10-CM | POA: Diagnosis not present

## 2022-10-29 DIAGNOSIS — M9904 Segmental and somatic dysfunction of sacral region: Secondary | ICD-10-CM | POA: Diagnosis not present

## 2022-10-31 DIAGNOSIS — M9904 Segmental and somatic dysfunction of sacral region: Secondary | ICD-10-CM | POA: Diagnosis not present

## 2022-10-31 DIAGNOSIS — M5137 Other intervertebral disc degeneration, lumbosacral region: Secondary | ICD-10-CM | POA: Diagnosis not present

## 2022-10-31 DIAGNOSIS — M62830 Muscle spasm of back: Secondary | ICD-10-CM | POA: Diagnosis not present

## 2022-10-31 DIAGNOSIS — M9903 Segmental and somatic dysfunction of lumbar region: Secondary | ICD-10-CM | POA: Diagnosis not present

## 2022-10-31 DIAGNOSIS — M5136 Other intervertebral disc degeneration, lumbar region: Secondary | ICD-10-CM | POA: Diagnosis not present

## 2022-10-31 DIAGNOSIS — M5442 Lumbago with sciatica, left side: Secondary | ICD-10-CM | POA: Diagnosis not present

## 2022-11-19 DIAGNOSIS — M9903 Segmental and somatic dysfunction of lumbar region: Secondary | ICD-10-CM | POA: Diagnosis not present

## 2022-11-19 DIAGNOSIS — M5136 Other intervertebral disc degeneration, lumbar region: Secondary | ICD-10-CM | POA: Diagnosis not present

## 2022-11-19 DIAGNOSIS — M5137 Other intervertebral disc degeneration, lumbosacral region: Secondary | ICD-10-CM | POA: Diagnosis not present

## 2022-11-19 DIAGNOSIS — M62830 Muscle spasm of back: Secondary | ICD-10-CM | POA: Diagnosis not present

## 2022-11-19 DIAGNOSIS — M5442 Lumbago with sciatica, left side: Secondary | ICD-10-CM | POA: Diagnosis not present

## 2022-11-19 DIAGNOSIS — M9904 Segmental and somatic dysfunction of sacral region: Secondary | ICD-10-CM | POA: Diagnosis not present

## 2022-11-21 DIAGNOSIS — M5137 Other intervertebral disc degeneration, lumbosacral region: Secondary | ICD-10-CM | POA: Diagnosis not present

## 2022-11-21 DIAGNOSIS — M9903 Segmental and somatic dysfunction of lumbar region: Secondary | ICD-10-CM | POA: Diagnosis not present

## 2022-11-21 DIAGNOSIS — M5442 Lumbago with sciatica, left side: Secondary | ICD-10-CM | POA: Diagnosis not present

## 2022-11-21 DIAGNOSIS — M5136 Other intervertebral disc degeneration, lumbar region: Secondary | ICD-10-CM | POA: Diagnosis not present

## 2022-11-21 DIAGNOSIS — M9904 Segmental and somatic dysfunction of sacral region: Secondary | ICD-10-CM | POA: Diagnosis not present

## 2022-11-21 DIAGNOSIS — M62830 Muscle spasm of back: Secondary | ICD-10-CM | POA: Diagnosis not present

## 2022-11-26 DIAGNOSIS — M5137 Other intervertebral disc degeneration, lumbosacral region: Secondary | ICD-10-CM | POA: Diagnosis not present

## 2022-11-26 DIAGNOSIS — M5136 Other intervertebral disc degeneration, lumbar region: Secondary | ICD-10-CM | POA: Diagnosis not present

## 2022-11-26 DIAGNOSIS — M9903 Segmental and somatic dysfunction of lumbar region: Secondary | ICD-10-CM | POA: Diagnosis not present

## 2022-11-26 DIAGNOSIS — M9904 Segmental and somatic dysfunction of sacral region: Secondary | ICD-10-CM | POA: Diagnosis not present

## 2022-11-26 DIAGNOSIS — M5442 Lumbago with sciatica, left side: Secondary | ICD-10-CM | POA: Diagnosis not present

## 2022-11-26 DIAGNOSIS — M62830 Muscle spasm of back: Secondary | ICD-10-CM | POA: Diagnosis not present

## 2022-12-31 DIAGNOSIS — M5442 Lumbago with sciatica, left side: Secondary | ICD-10-CM | POA: Diagnosis not present

## 2022-12-31 DIAGNOSIS — M5137 Other intervertebral disc degeneration, lumbosacral region: Secondary | ICD-10-CM | POA: Diagnosis not present

## 2022-12-31 DIAGNOSIS — M9904 Segmental and somatic dysfunction of sacral region: Secondary | ICD-10-CM | POA: Diagnosis not present

## 2022-12-31 DIAGNOSIS — M62830 Muscle spasm of back: Secondary | ICD-10-CM | POA: Diagnosis not present

## 2022-12-31 DIAGNOSIS — M5136 Other intervertebral disc degeneration, lumbar region: Secondary | ICD-10-CM | POA: Diagnosis not present

## 2022-12-31 DIAGNOSIS — M9903 Segmental and somatic dysfunction of lumbar region: Secondary | ICD-10-CM | POA: Diagnosis not present

## 2023-01-06 DIAGNOSIS — K760 Fatty (change of) liver, not elsewhere classified: Secondary | ICD-10-CM | POA: Diagnosis not present

## 2023-01-07 DIAGNOSIS — M5136 Other intervertebral disc degeneration, lumbar region: Secondary | ICD-10-CM | POA: Diagnosis not present

## 2023-01-07 DIAGNOSIS — M9904 Segmental and somatic dysfunction of sacral region: Secondary | ICD-10-CM | POA: Diagnosis not present

## 2023-01-07 DIAGNOSIS — M5137 Other intervertebral disc degeneration, lumbosacral region: Secondary | ICD-10-CM | POA: Diagnosis not present

## 2023-01-07 DIAGNOSIS — M5442 Lumbago with sciatica, left side: Secondary | ICD-10-CM | POA: Diagnosis not present

## 2023-01-07 DIAGNOSIS — M62830 Muscle spasm of back: Secondary | ICD-10-CM | POA: Diagnosis not present

## 2023-01-07 DIAGNOSIS — M9903 Segmental and somatic dysfunction of lumbar region: Secondary | ICD-10-CM | POA: Diagnosis not present

## 2023-01-14 DIAGNOSIS — M5442 Lumbago with sciatica, left side: Secondary | ICD-10-CM | POA: Diagnosis not present

## 2023-01-14 DIAGNOSIS — M9904 Segmental and somatic dysfunction of sacral region: Secondary | ICD-10-CM | POA: Diagnosis not present

## 2023-01-14 DIAGNOSIS — M5136 Other intervertebral disc degeneration, lumbar region: Secondary | ICD-10-CM | POA: Diagnosis not present

## 2023-01-14 DIAGNOSIS — M62830 Muscle spasm of back: Secondary | ICD-10-CM | POA: Diagnosis not present

## 2023-01-14 DIAGNOSIS — M9903 Segmental and somatic dysfunction of lumbar region: Secondary | ICD-10-CM | POA: Diagnosis not present

## 2023-01-14 DIAGNOSIS — M5137 Other intervertebral disc degeneration, lumbosacral region: Secondary | ICD-10-CM | POA: Diagnosis not present

## 2023-01-21 DIAGNOSIS — M5137 Other intervertebral disc degeneration, lumbosacral region: Secondary | ICD-10-CM | POA: Diagnosis not present

## 2023-01-21 DIAGNOSIS — M9903 Segmental and somatic dysfunction of lumbar region: Secondary | ICD-10-CM | POA: Diagnosis not present

## 2023-01-21 DIAGNOSIS — M62830 Muscle spasm of back: Secondary | ICD-10-CM | POA: Diagnosis not present

## 2023-01-21 DIAGNOSIS — M9904 Segmental and somatic dysfunction of sacral region: Secondary | ICD-10-CM | POA: Diagnosis not present

## 2023-01-21 DIAGNOSIS — M5442 Lumbago with sciatica, left side: Secondary | ICD-10-CM | POA: Diagnosis not present

## 2023-01-21 DIAGNOSIS — M5136 Other intervertebral disc degeneration, lumbar region: Secondary | ICD-10-CM | POA: Diagnosis not present

## 2023-01-29 DIAGNOSIS — M62830 Muscle spasm of back: Secondary | ICD-10-CM | POA: Diagnosis not present

## 2023-01-29 DIAGNOSIS — M5137 Other intervertebral disc degeneration, lumbosacral region: Secondary | ICD-10-CM | POA: Diagnosis not present

## 2023-01-29 DIAGNOSIS — M5442 Lumbago with sciatica, left side: Secondary | ICD-10-CM | POA: Diagnosis not present

## 2023-01-29 DIAGNOSIS — M5136 Other intervertebral disc degeneration, lumbar region: Secondary | ICD-10-CM | POA: Diagnosis not present

## 2023-01-29 DIAGNOSIS — M9904 Segmental and somatic dysfunction of sacral region: Secondary | ICD-10-CM | POA: Diagnosis not present

## 2023-01-29 DIAGNOSIS — M9903 Segmental and somatic dysfunction of lumbar region: Secondary | ICD-10-CM | POA: Diagnosis not present

## 2023-02-04 DIAGNOSIS — D485 Neoplasm of uncertain behavior of skin: Secondary | ICD-10-CM | POA: Diagnosis not present

## 2023-02-04 DIAGNOSIS — D225 Melanocytic nevi of trunk: Secondary | ICD-10-CM | POA: Diagnosis not present

## 2023-03-13 DIAGNOSIS — R69 Illness, unspecified: Secondary | ICD-10-CM | POA: Diagnosis not present

## 2023-04-09 DIAGNOSIS — H02834 Dermatochalasis of left upper eyelid: Secondary | ICD-10-CM | POA: Diagnosis not present

## 2023-04-09 DIAGNOSIS — H02831 Dermatochalasis of right upper eyelid: Secondary | ICD-10-CM | POA: Diagnosis not present

## 2023-05-07 DIAGNOSIS — I2510 Atherosclerotic heart disease of native coronary artery without angina pectoris: Secondary | ICD-10-CM | POA: Diagnosis not present

## 2023-05-07 DIAGNOSIS — R600 Localized edema: Secondary | ICD-10-CM | POA: Diagnosis not present

## 2023-05-07 DIAGNOSIS — E669 Obesity, unspecified: Secondary | ICD-10-CM | POA: Diagnosis not present

## 2023-05-07 DIAGNOSIS — J439 Emphysema, unspecified: Secondary | ICD-10-CM | POA: Diagnosis not present

## 2023-05-07 DIAGNOSIS — Z008 Encounter for other general examination: Secondary | ICD-10-CM | POA: Diagnosis not present

## 2023-05-07 DIAGNOSIS — M5430 Sciatica, unspecified side: Secondary | ICD-10-CM | POA: Diagnosis not present

## 2023-05-07 DIAGNOSIS — Z87891 Personal history of nicotine dependence: Secondary | ICD-10-CM | POA: Diagnosis not present

## 2023-05-07 DIAGNOSIS — E785 Hyperlipidemia, unspecified: Secondary | ICD-10-CM | POA: Diagnosis not present

## 2023-05-07 DIAGNOSIS — M1990 Unspecified osteoarthritis, unspecified site: Secondary | ICD-10-CM | POA: Diagnosis not present

## 2023-05-07 DIAGNOSIS — K760 Fatty (change of) liver, not elsewhere classified: Secondary | ICD-10-CM | POA: Diagnosis not present

## 2023-05-07 DIAGNOSIS — G629 Polyneuropathy, unspecified: Secondary | ICD-10-CM | POA: Diagnosis not present

## 2023-05-07 DIAGNOSIS — I10 Essential (primary) hypertension: Secondary | ICD-10-CM | POA: Diagnosis not present

## 2023-05-07 DIAGNOSIS — N529 Male erectile dysfunction, unspecified: Secondary | ICD-10-CM | POA: Diagnosis not present

## 2023-05-08 DIAGNOSIS — R69 Illness, unspecified: Secondary | ICD-10-CM | POA: Diagnosis not present

## 2023-05-20 DIAGNOSIS — L570 Actinic keratosis: Secondary | ICD-10-CM | POA: Diagnosis not present

## 2023-05-20 DIAGNOSIS — L814 Other melanin hyperpigmentation: Secondary | ICD-10-CM | POA: Diagnosis not present

## 2023-05-20 DIAGNOSIS — D485 Neoplasm of uncertain behavior of skin: Secondary | ICD-10-CM | POA: Diagnosis not present

## 2023-05-20 DIAGNOSIS — D2239 Melanocytic nevi of other parts of face: Secondary | ICD-10-CM | POA: Diagnosis not present

## 2023-05-20 DIAGNOSIS — L820 Inflamed seborrheic keratosis: Secondary | ICD-10-CM | POA: Diagnosis not present

## 2023-06-18 DIAGNOSIS — U071 COVID-19: Secondary | ICD-10-CM | POA: Diagnosis not present

## 2023-06-26 DIAGNOSIS — R69 Illness, unspecified: Secondary | ICD-10-CM | POA: Diagnosis not present

## 2023-07-10 DIAGNOSIS — R69 Illness, unspecified: Secondary | ICD-10-CM | POA: Diagnosis not present

## 2023-07-31 DIAGNOSIS — R69 Illness, unspecified: Secondary | ICD-10-CM | POA: Diagnosis not present

## 2023-08-01 DIAGNOSIS — H02831 Dermatochalasis of right upper eyelid: Secondary | ICD-10-CM | POA: Diagnosis not present

## 2023-08-01 DIAGNOSIS — H02834 Dermatochalasis of left upper eyelid: Secondary | ICD-10-CM | POA: Diagnosis not present

## 2023-08-01 DIAGNOSIS — Z01818 Encounter for other preprocedural examination: Secondary | ICD-10-CM | POA: Diagnosis not present

## 2023-08-14 DIAGNOSIS — R69 Illness, unspecified: Secondary | ICD-10-CM | POA: Diagnosis not present

## 2023-09-04 DIAGNOSIS — R69 Illness, unspecified: Secondary | ICD-10-CM | POA: Diagnosis not present

## 2023-09-10 DIAGNOSIS — M62830 Muscle spasm of back: Secondary | ICD-10-CM | POA: Diagnosis not present

## 2023-09-10 DIAGNOSIS — M51372 Other intervertebral disc degeneration, lumbosacral region with discogenic back pain and lower extremity pain: Secondary | ICD-10-CM | POA: Diagnosis not present

## 2023-09-10 DIAGNOSIS — M5442 Lumbago with sciatica, left side: Secondary | ICD-10-CM | POA: Diagnosis not present

## 2023-09-10 DIAGNOSIS — M9904 Segmental and somatic dysfunction of sacral region: Secondary | ICD-10-CM | POA: Diagnosis not present

## 2023-09-10 DIAGNOSIS — M9903 Segmental and somatic dysfunction of lumbar region: Secondary | ICD-10-CM | POA: Diagnosis not present

## 2023-09-11 DIAGNOSIS — R69 Illness, unspecified: Secondary | ICD-10-CM | POA: Diagnosis not present

## 2023-09-18 DIAGNOSIS — R69 Illness, unspecified: Secondary | ICD-10-CM | POA: Diagnosis not present

## 2023-09-29 DIAGNOSIS — D485 Neoplasm of uncertain behavior of skin: Secondary | ICD-10-CM | POA: Diagnosis not present

## 2023-10-14 DIAGNOSIS — M62830 Muscle spasm of back: Secondary | ICD-10-CM | POA: Diagnosis not present

## 2023-10-14 DIAGNOSIS — M51372 Other intervertebral disc degeneration, lumbosacral region with discogenic back pain and lower extremity pain: Secondary | ICD-10-CM | POA: Diagnosis not present

## 2023-10-14 DIAGNOSIS — M9903 Segmental and somatic dysfunction of lumbar region: Secondary | ICD-10-CM | POA: Diagnosis not present

## 2023-10-14 DIAGNOSIS — M5442 Lumbago with sciatica, left side: Secondary | ICD-10-CM | POA: Diagnosis not present

## 2023-10-14 DIAGNOSIS — M9904 Segmental and somatic dysfunction of sacral region: Secondary | ICD-10-CM | POA: Diagnosis not present

## 2023-10-21 DIAGNOSIS — M9903 Segmental and somatic dysfunction of lumbar region: Secondary | ICD-10-CM | POA: Diagnosis not present

## 2023-10-21 DIAGNOSIS — M51372 Other intervertebral disc degeneration, lumbosacral region with discogenic back pain and lower extremity pain: Secondary | ICD-10-CM | POA: Diagnosis not present

## 2023-10-21 DIAGNOSIS — M9904 Segmental and somatic dysfunction of sacral region: Secondary | ICD-10-CM | POA: Diagnosis not present

## 2023-10-21 DIAGNOSIS — M5442 Lumbago with sciatica, left side: Secondary | ICD-10-CM | POA: Diagnosis not present

## 2023-10-21 DIAGNOSIS — M62830 Muscle spasm of back: Secondary | ICD-10-CM | POA: Diagnosis not present

## 2023-10-22 DIAGNOSIS — Z122 Encounter for screening for malignant neoplasm of respiratory organs: Secondary | ICD-10-CM | POA: Diagnosis not present

## 2023-10-22 DIAGNOSIS — Z87891 Personal history of nicotine dependence: Secondary | ICD-10-CM | POA: Diagnosis not present

## 2023-10-25 DIAGNOSIS — F17210 Nicotine dependence, cigarettes, uncomplicated: Secondary | ICD-10-CM | POA: Diagnosis not present

## 2023-11-19 DIAGNOSIS — D485 Neoplasm of uncertain behavior of skin: Secondary | ICD-10-CM | POA: Diagnosis not present

## 2023-11-19 DIAGNOSIS — D2239 Melanocytic nevi of other parts of face: Secondary | ICD-10-CM | POA: Diagnosis not present

## 2023-11-19 DIAGNOSIS — L814 Other melanin hyperpigmentation: Secondary | ICD-10-CM | POA: Diagnosis not present

## 2023-11-19 DIAGNOSIS — L720 Epidermal cyst: Secondary | ICD-10-CM | POA: Diagnosis not present

## 2023-11-19 DIAGNOSIS — D225 Melanocytic nevi of trunk: Secondary | ICD-10-CM | POA: Diagnosis not present

## 2023-12-09 DIAGNOSIS — L02212 Cutaneous abscess of back [any part, except buttock]: Secondary | ICD-10-CM | POA: Diagnosis not present

## 2023-12-09 DIAGNOSIS — D485 Neoplasm of uncertain behavior of skin: Secondary | ICD-10-CM | POA: Diagnosis not present

## 2023-12-09 DIAGNOSIS — L720 Epidermal cyst: Secondary | ICD-10-CM | POA: Diagnosis not present

## 2023-12-24 DIAGNOSIS — D485 Neoplasm of uncertain behavior of skin: Secondary | ICD-10-CM | POA: Diagnosis not present

## 2023-12-31 DIAGNOSIS — D485 Neoplasm of uncertain behavior of skin: Secondary | ICD-10-CM | POA: Diagnosis not present

## 2024-01-21 DIAGNOSIS — L089 Local infection of the skin and subcutaneous tissue, unspecified: Secondary | ICD-10-CM | POA: Diagnosis not present

## 2024-01-30 ENCOUNTER — Ambulatory Visit (HOSPITAL_BASED_OUTPATIENT_CLINIC_OR_DEPARTMENT_OTHER): Admission: EM | Admit: 2024-01-30 | Discharge: 2024-01-30 | Disposition: A | Discharge status: Home / Self Care

## 2024-01-30 ENCOUNTER — Encounter (HOSPITAL_BASED_OUTPATIENT_CLINIC_OR_DEPARTMENT_OTHER): Payer: Self-pay

## 2024-01-30 DIAGNOSIS — B349 Viral infection, unspecified: Secondary | ICD-10-CM | POA: Diagnosis not present

## 2024-01-30 LAB — POC COVID19/FLU A&B COMBO
Covid Antigen, POC: NEGATIVE
Influenza A Antigen, POC: NEGATIVE
Influenza B Antigen, POC: NEGATIVE

## 2024-01-30 NOTE — ED Triage Notes (Signed)
 Fever with chills during the night. Headache, joint aching. No cough, no respiratory symptoms. Took 2 covid tests today and both were negative. Feeling fatigued. Denies urinary symptoms. Taking an antibiotic due to a cyst removal to mid back. Patient reports it is not healing.

## 2024-01-30 NOTE — Discharge Instructions (Signed)
 COVID and flu test negative. This is most likely a virus. You can take tylenol  and motrin for pain and fever . You may be contagious as long as you have a fever.

## 2024-01-30 NOTE — ED Provider Notes (Signed)
 [facility]    CSN: [id number] Arrival date & time: 01/30/24  1358      History   Chief Complaint Chief Complaint  Patient presents with   Fever   Chills    HPI Casey Webster is a 73 y.o. male.   73 year old male presents today with fever, chills, headache, joint pain.  This started yesterday.  Feeling fatigued.  Took 2 COVID test at home that were negative.  Denies any cough, congestion, urinary symptoms.  Concerned due to visiting a friend tomorrow and does not want to spread any germs.   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 Authorizing Provider  sulfamethoxazole-trimethoprim (BACTRIM DS) 800-160 MG tablet Take 1 tablet by mouth 2 (two) times daily. 01/21/24  Yes [provider]  ZETIA 10 MG tablet Take 10 mg by mouth daily. 09/22/23  Yes [provider]  atorvastatin  (LIPITOR) 20 MG tablet Take 10 mg by  mouth daily.    [provider]  Cetirizine HCl 10 MG CAPS Take 1 capsule by mouth daily.    [provider]  Multiple Vitamin (MULTIVITAMIN WITH MINERALS) TABS tablet Take 1 tablet by mouth daily.    [provider]  sildenafil (VIAGRA) 50 MG tablet Take 50 mg by mouth daily as needed for erectile dysfunction.    [provider]  testosterone  cypionate (DEPOTESTOSTERONE CYPIONATE) 200 MG/ML injection every 14 (fourteen) days. 05/22/17   [provider]  vitamin B-12 (CYANOCOBALAMIN) 1000 MCG tablet Take 1,000 mcg by mouth daily.    [provider]    Family History Family History  Problem Relation Age of Onset   Stroke Mother    Heart Problems Father    Heart murmur Sister    Heart Problems Sister     Social History Social History   Tobacco Use   Smoking status: Former    Current packs/day: 0.00    Average packs/day: 1 pack/day for 40.0 years (40.0 ttl pk-yrs)    Types: Cigarettes    Start date: 10/05/1971    Quit date: 10/05/2011    Years since quitting: 12.3   Smokeless tobacco: Never  Vaping Use   Vaping status: Former   Quit date: 10/05/2011  Substance Use Topics   Alcohol use: Yes    Alcohol/week: 21.0 standard drinks of alcohol  Types: 21 Shots of liquor per week    Comment: VODKA   Drug use: No     Allergies   Patient has no known allergies.   Review of Systems Review of Systems  Constitutional:  Positive for fever.     Physical Exam Triage Vital Signs ED Triage Vitals  Encounter Vitals Group     BP 01/30/24 1417 134/68     Systolic BP Percentile --      Diastolic BP Percentile --      Pulse Rate 01/30/24 1417 63     Resp 01/30/24 1417 20     Temp 01/30/24 1417 99 F (37.2 C)     Temp Source 01/30/24 1417 Oral     SpO2 01/30/24 1417 96 %     Weight --      Height --      Head Circumference --      Peak Flow --      Pain Score 01/30/24 1419 7     Pain Loc --      Pain Education --       Exclude from Growth Chart --    No data found.  Updated Vital Signs BP 134/68 (BP Location: Right Arm)   Pulse 63   Temp 99 F (37.2 C) (Oral)   Resp 20   SpO2 96%   Visual Acuity Right Eye Distance:   Left Eye Distance:   Bilateral Distance:    Right Eye Near:   Left Eye Near:    Bilateral Near:     Physical Exam Constitutional:      General: He is not in acute distress.    Appearance: Normal appearance. He is not ill-appearing, toxic-appearing or diaphoretic.  HENT:     Right Ear: Tympanic membrane, ear canal and external ear normal.     Left Ear: Tympanic membrane, ear canal and external ear normal.     Mouth/Throat:     Pharynx: Oropharynx is clear.  Eyes:     Conjunctiva/sclera: Conjunctivae normal.  Cardiovascular:     Rate and Rhythm: Normal rate and regular rhythm.     Pulses: Normal pulses.     Heart sounds: Normal heart sounds.  Pulmonary:     Effort: Pulmonary effort is normal.     Breath sounds: Normal breath sounds.  Musculoskeletal:        General: Normal range of motion.  Skin:    General: Skin is warm and dry.  Neurological:     Mental Status: He is alert.  Psychiatric:        Mood and Affect: Mood normal.      UC Treatments / Results  Labs (all labs ordered are listed, but only abnormal results are displayed) Labs Reviewed  POC COVID19/FLU A&B COMBO - Normal    EKG   Radiology No results found.  Procedures Procedures (including critical care time)  Medications Ordered in UC Medications - No data to display  Initial Impression / Assessment and Plan / UC Course  I have reviewed the triage vital signs and the nursing notes.  Pertinent labs & imaging results that were available during my care of the patient were reviewed by me and considered in my medical decision making (see chart for details).     Viral illness-COVID and flu testing negative.  Had 2 negative COVID test at home.  This is most likely some sort of viral illness.   No concerns on exam or findings. Recommend rest, hydrate and he  can take Motrin and Tylenol  for pain and fever. Being that he has a fever he most likely is still contagious and would not recommend seeing a sick friend at this point. Final Clinical Impressions(s) / UC Diagnoses   Final diagnoses:  Viral illness   Discharge Instructions      COVID and flu test negative. This is most likely a virus. You can take tylenol  and motrin for pain and fever . You may be contagious as long as you have a fever.   ED Prescriptions   None    PDMP not reviewed this encounter.   Landa Pine, FNP 01/30/24 1928

## 2024-02-02 DIAGNOSIS — M51372 Other intervertebral disc degeneration, lumbosacral region with discogenic back pain and lower extremity pain: Secondary | ICD-10-CM | POA: Diagnosis not present

## 2024-02-02 DIAGNOSIS — M9903 Segmental and somatic dysfunction of lumbar region: Secondary | ICD-10-CM | POA: Diagnosis not present

## 2024-02-02 DIAGNOSIS — M5442 Lumbago with sciatica, left side: Secondary | ICD-10-CM | POA: Diagnosis not present

## 2024-02-02 DIAGNOSIS — M9904 Segmental and somatic dysfunction of sacral region: Secondary | ICD-10-CM | POA: Diagnosis not present

## 2024-02-02 DIAGNOSIS — M62830 Muscle spasm of back: Secondary | ICD-10-CM | POA: Diagnosis not present

## 2024-02-04 ENCOUNTER — Ambulatory Visit (INDEPENDENT_AMBULATORY_CARE_PROVIDER_SITE_OTHER)
Admit: 2024-02-04 | Discharge: 2024-02-04 | Disposition: A | Discharge status: Home / Self Care | Attending: Family Medicine | Admitting: Family Medicine

## 2024-02-04 ENCOUNTER — Ambulatory Visit (HOSPITAL_BASED_OUTPATIENT_CLINIC_OR_DEPARTMENT_OTHER)
Admission: EM | Admit: 2024-02-04 | Discharge: 2024-02-04 | Disposition: A | Discharge status: Home / Self Care | Attending: Family Medicine | Admitting: Family Medicine

## 2024-02-04 ENCOUNTER — Encounter (HOSPITAL_BASED_OUTPATIENT_CLINIC_OR_DEPARTMENT_OTHER): Payer: Self-pay

## 2024-02-04 DIAGNOSIS — M5442 Lumbago with sciatica, left side: Secondary | ICD-10-CM | POA: Diagnosis not present

## 2024-02-04 DIAGNOSIS — M62830 Muscle spasm of back: Secondary | ICD-10-CM | POA: Diagnosis not present

## 2024-02-04 DIAGNOSIS — M19011 Primary osteoarthritis, right shoulder: Secondary | ICD-10-CM | POA: Diagnosis not present

## 2024-02-04 DIAGNOSIS — M25511 Pain in right shoulder: Secondary | ICD-10-CM

## 2024-02-04 DIAGNOSIS — S29009A Unspecified injury of muscle and tendon of unspecified wall of thorax, initial encounter: Secondary | ICD-10-CM | POA: Diagnosis not present

## 2024-02-04 DIAGNOSIS — S4991XA Unspecified injury of right shoulder and upper arm, initial encounter: Secondary | ICD-10-CM | POA: Diagnosis not present

## 2024-02-04 DIAGNOSIS — W19XXXA Unspecified fall, initial encounter: Secondary | ICD-10-CM

## 2024-02-04 DIAGNOSIS — M51372 Other intervertebral disc degeneration, lumbosacral region with discogenic back pain and lower extremity pain: Secondary | ICD-10-CM | POA: Diagnosis not present

## 2024-02-04 DIAGNOSIS — M9903 Segmental and somatic dysfunction of lumbar region: Secondary | ICD-10-CM | POA: Diagnosis not present

## 2024-02-04 DIAGNOSIS — M9904 Segmental and somatic dysfunction of sacral region: Secondary | ICD-10-CM | POA: Diagnosis not present

## 2024-02-04 NOTE — Discharge Instructions (Signed)
 Your xrays did not show any fractures. Most likely strained the areas and bruised. You can ice the areas and take anti-inflammatories OTC for pain and inflammation.

## 2024-02-04 NOTE — ED Provider Notes (Signed)
 [facility]    CSN: [id number] Arrival date & time: 02/04/24  1049      History   Chief Complaint Chief Complaint  Patient presents with   Shoulder Pain   Rib Injury    HPI Casey Webster is a 73 y.o. male.   73 year old male presents today with shoulder with right shoulder pain and right rib pain after a fall.  Slipped on the outdoor steps Monday landing on the right side.  Pain is worse with deep breathing and movements.  Shoulder not as bad but is painful with certain movements.  Denies any numbness, tingling or weakness.   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 Authorizing Provider  atorvastatin  (LIPITOR) 20 MG tablet Take 10 mg by mouth daily.    [provider]  Cetirizine HCl 10 MG CAPS Take 1 capsule by mouth daily.    [provider]  Multiple Vitamin (MULTIVITAMIN WITH MINERALS) TABS tablet  Take 1 tablet by mouth daily.    [provider]  sildenafil (VIAGRA) 50 MG tablet Take 50 mg by mouth daily as needed for erectile dysfunction.    [provider]  sulfamethoxazole-trimethoprim (BACTRIM DS) 800-160 MG tablet Take 1 tablet by mouth 2 (two) times daily. 01/21/24   [provider]  testosterone  cypionate (DEPOTESTOSTERONE CYPIONATE) 200 MG/ML injection every 14 (fourteen) days. 05/22/17   [provider]  vitamin B-12 (CYANOCOBALAMIN) 1000 MCG tablet Take 1,000 mcg by mouth daily.    [provider]  ZETIA 10 MG tablet Take 10 mg by mouth daily. 09/22/23   [provider]    Family History Family History  Problem Relation Age of Onset   Stroke Mother    Heart Problems Father    Heart murmur Sister    Heart Problems Sister     Social History Social History   Tobacco Use   Smoking status: Former    Current packs/day: 0.00    Average packs/day: 1 pack/day for 40.0 years (40.0 ttl pk-yrs)    Types: Cigarettes    Start date: 10/05/1971    Quit date: 10/05/2011    Years since quitting: 12.3   Smokeless tobacco: Never  Vaping Use   Vaping status: Former   Quit date: 10/05/2011  Substance Use Topics   Alcohol use: Yes  Alcohol/week: 21.0 standard drinks of alcohol    Types: 21 Shots of liquor per week    Comment: VODKA   Drug use: No     Allergies   Patient has no known allergies.   Review of Systems Review of Systems  See HPI Physical Exam Triage Vital Signs ED Triage Vitals  Encounter Vitals Group     BP 02/04/24 1059 118/68     Systolic BP Percentile --      Diastolic BP Percentile --      Pulse Rate 02/04/24 1059 60     Resp 02/04/24 1059 20     Temp 02/04/24 1059 97.8 F (36.6 C)     Temp Source 02/04/24 1059 Oral     SpO2 02/04/24 1059 96 %     Weight --      Height --      Head Circumference --      Peak Flow --      Pain Score 02/04/24 1101 7     Pain Loc --      Pain Education --       Exclude from Growth Chart --    No data found.  Updated Vital Signs BP 118/68 (BP Location: Right Arm)   Pulse 60   Temp 97.8 F (36.6 C) (Oral)   Resp 20   SpO2 96%   Visual Acuity Right Eye Distance:   Left Eye Distance:   Bilateral Distance:    Right Eye Near:   Left Eye Near:    Bilateral Near:     Physical Exam Vitals and nursing note reviewed.  Constitutional:      Appearance: Normal appearance.  Pulmonary:     Effort: Pulmonary effort is normal.  Chest:    Musculoskeletal:        General: Normal range of motion.       Arms:  Neurological:     Mental Status: He is alert.  Psychiatric:        Mood and Affect: Mood normal.      UC Treatments / Results  Labs (all labs ordered are listed, but only abnormal results are displayed) Labs Reviewed - No data to display  EKG   Radiology DG Ribs Unilateral W/Chest Right Result Date: 02/04/2024 CLINICAL DATA:  Fall and trauma to the right chest wall. EXAM: RIGHT RIBS AND CHEST - 3+ VIEW COMPARISON:  Chest radiograph dated 09/04/2015. FINDINGS: No consolidative changes. Biapical subpleural scarring. There is no pleural effusion pneumothorax. The cardiac silhouette is within limits. No acute osseous pathology. No displaced rib fractures. IMPRESSION: 1. No acute cardiopulmonary process. 2. No displaced rib fractures. Electronically Signed   By: Angus Bark M.D.   On: 02/04/2024 11:45   DG Shoulder Right Result Date: 02/04/2024 CLINICAL DATA:  Fall and trauma to the right shoulder. EXAM: RIGHT SHOULDER - 2+ VIEW COMPARISON:  None Available. FINDINGS: There is no acute fracture or dislocation. There is elevation of the right humeral head suggestive of chronic rotator cuff injury. Degenerative changes of the right AC joint. The soft tissues are unremarkable. IMPRESSION: 1. No acute fracture or dislocation. 2. Findings suggestive of chronic rotator cuff injury. Electronically Signed   By: Angus Bark M.D.   On:  02/04/2024 11:43    Procedures Procedures (including critical care time)  Medications Ordered in UC Medications - No data to display  Initial Impression / Assessment and Plan / UC Course  I have reviewed the triage vital signs and the nursing  notes.  Pertinent labs & imaging results that were available during my care of the patient were reviewed by me and considered in my medical decision making (see chart for details).     Fall with right rib and shoulder pain- X-rays without any acute fractures. Most likely bruised/ strained muscle. Does have chronic rotator cuff injury. Made pt aware of this.  Rest, Ice, Elevate and can take OTC pain meds as needed.  See ortho if issues continue  Final Clinical Impressions(s) / UC Diagnoses   Final diagnoses:  Fall, initial encounter     Discharge Instructions      Your xrays did not show any fractures. Most likely strained the areas and bruised. You can ice the areas and take anti-inflammatories OTC for pain and inflammation.   ED Prescriptions   None    PDMP not reviewed this encounter.   Landa Pine, FNP 02/04/24 1644

## 2024-02-04 NOTE — ED Triage Notes (Signed)
 Slipped on outdoor steps Monday. Right shoulder pain and right rib pain. Able to take deep breath without discomfort but some movement does increase the pain.

## 2024-02-25 DIAGNOSIS — L723 Sebaceous cyst: Secondary | ICD-10-CM | POA: Diagnosis not present

## 2024-02-25 DIAGNOSIS — L089 Local infection of the skin and subcutaneous tissue, unspecified: Secondary | ICD-10-CM | POA: Diagnosis not present

## 2024-02-25 DIAGNOSIS — L720 Epidermal cyst: Secondary | ICD-10-CM | POA: Diagnosis not present

## 2024-02-27 DIAGNOSIS — Z809 Family history of malignant neoplasm, unspecified: Secondary | ICD-10-CM | POA: Diagnosis not present

## 2024-02-27 DIAGNOSIS — Z8249 Family history of ischemic heart disease and other diseases of the circulatory system: Secondary | ICD-10-CM | POA: Diagnosis not present

## 2024-02-27 DIAGNOSIS — J309 Allergic rhinitis, unspecified: Secondary | ICD-10-CM | POA: Diagnosis not present

## 2024-02-27 DIAGNOSIS — N529 Male erectile dysfunction, unspecified: Secondary | ICD-10-CM | POA: Diagnosis not present

## 2024-02-27 DIAGNOSIS — I700 Atherosclerosis of aorta: Secondary | ICD-10-CM | POA: Diagnosis not present

## 2024-02-27 DIAGNOSIS — E785 Hyperlipidemia, unspecified: Secondary | ICD-10-CM | POA: Diagnosis not present

## 2024-02-27 DIAGNOSIS — Z008 Encounter for other general examination: Secondary | ICD-10-CM | POA: Diagnosis not present

## 2024-02-27 DIAGNOSIS — G629 Polyneuropathy, unspecified: Secondary | ICD-10-CM | POA: Diagnosis not present

## 2024-02-27 DIAGNOSIS — Z87891 Personal history of nicotine dependence: Secondary | ICD-10-CM | POA: Diagnosis not present

## 2024-02-27 DIAGNOSIS — M1990 Unspecified osteoarthritis, unspecified site: Secondary | ICD-10-CM | POA: Diagnosis not present

## 2024-02-27 DIAGNOSIS — I10 Essential (primary) hypertension: Secondary | ICD-10-CM | POA: Diagnosis not present

## 2024-02-27 DIAGNOSIS — Z833 Family history of diabetes mellitus: Secondary | ICD-10-CM | POA: Diagnosis not present

## 2024-04-21 DIAGNOSIS — L820 Inflamed seborrheic keratosis: Secondary | ICD-10-CM | POA: Diagnosis not present

## 2024-04-21 DIAGNOSIS — D485 Neoplasm of uncertain behavior of skin: Secondary | ICD-10-CM | POA: Diagnosis not present

## 2024-04-21 DIAGNOSIS — L918 Other hypertrophic disorders of the skin: Secondary | ICD-10-CM | POA: Diagnosis not present

## 2024-04-21 DIAGNOSIS — L728 Other follicular cysts of the skin and subcutaneous tissue: Secondary | ICD-10-CM | POA: Diagnosis not present

## 2024-04-22 DIAGNOSIS — E785 Hyperlipidemia, unspecified: Secondary | ICD-10-CM | POA: Diagnosis not present

## 2024-04-22 DIAGNOSIS — I2510 Atherosclerotic heart disease of native coronary artery without angina pectoris: Secondary | ICD-10-CM | POA: Diagnosis not present

## 2024-05-23 DIAGNOSIS — E785 Hyperlipidemia, unspecified: Secondary | ICD-10-CM | POA: Diagnosis not present

## 2024-05-23 DIAGNOSIS — I2510 Atherosclerotic heart disease of native coronary artery without angina pectoris: Secondary | ICD-10-CM | POA: Diagnosis not present

## 2024-06-22 DIAGNOSIS — E785 Hyperlipidemia, unspecified: Secondary | ICD-10-CM | POA: Diagnosis not present

## 2024-06-22 DIAGNOSIS — I2510 Atherosclerotic heart disease of native coronary artery without angina pectoris: Secondary | ICD-10-CM | POA: Diagnosis not present

## 2024-07-23 DIAGNOSIS — E785 Hyperlipidemia, unspecified: Secondary | ICD-10-CM | POA: Diagnosis not present

## 2024-07-23 DIAGNOSIS — I2510 Atherosclerotic heart disease of native coronary artery without angina pectoris: Secondary | ICD-10-CM | POA: Diagnosis not present

## 2024-08-22 DIAGNOSIS — E785 Hyperlipidemia, unspecified: Secondary | ICD-10-CM | POA: Diagnosis not present

## 2024-08-22 DIAGNOSIS — I2510 Atherosclerotic heart disease of native coronary artery without angina pectoris: Secondary | ICD-10-CM | POA: Diagnosis not present

## 2024-09-26 ENCOUNTER — Ambulatory Visit (INDEPENDENT_AMBULATORY_CARE_PROVIDER_SITE_OTHER): Admit: 2024-09-26 | Discharge: 2024-09-26 | Disposition: A | Discharge status: Home / Self Care | Admitting: Radiology

## 2024-09-26 ENCOUNTER — Ambulatory Visit (HOSPITAL_BASED_OUTPATIENT_CLINIC_OR_DEPARTMENT_OTHER)
Admission: RE | Admit: 2024-09-26 | Discharge: 2024-09-26 | Disposition: A | Discharge status: Home / Self Care | Source: Ambulatory Visit | Attending: Family Medicine | Admitting: Family Medicine

## 2024-09-26 ENCOUNTER — Ambulatory Visit (HOSPITAL_BASED_OUTPATIENT_CLINIC_OR_DEPARTMENT_OTHER): Admitting: Radiology

## 2024-09-26 ENCOUNTER — Encounter (HOSPITAL_BASED_OUTPATIENT_CLINIC_OR_DEPARTMENT_OTHER): Payer: Self-pay

## 2024-09-26 VITALS — BP 132/72 | HR 58 | Temp 98.4°F | Resp 20

## 2024-09-26 DIAGNOSIS — M25571 Pain in right ankle and joints of right foot: Secondary | ICD-10-CM

## 2024-09-26 NOTE — ED Provider Notes (Signed)
 " [facility]    CSN: [id number] Arrival date & time: 09/26/24  1320      History   Chief Complaint Chief Complaint  Patient presents with   Ankle Pain    HPI [facility] Webster is a 74 y.o. male.   Right ankle pain x 1 month after rolling it while in the yard. Patient states it was swollen for about a week and didn't seem all that painful until last 10 days when it is now swollen all the time.Has been wearing ankle brace and compression socks for comfort and stability.    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 Authorizing Provider  atorvastatin  (LIPITOR) 20 MG tablet Take 10 mg by mouth daily.    [provider]  Cetirizine HCl 10 MG CAPS Take 1 capsule by mouth daily.    [provider]  Multiple Vitamin (MULTIVITAMIN WITH MINERALS) TABS tablet Take 1 tablet by mouth daily.    [provider]   sildenafil (VIAGRA) 50 MG tablet Take 50 mg by mouth daily as needed for erectile dysfunction.    [provider]  sulfamethoxazole-trimethoprim (BACTRIM DS) 800-160 MG tablet Take 1 tablet by mouth 2 (two) times daily. 01/21/24   [provider]  testosterone  cypionate (DEPOTESTOSTERONE CYPIONATE) 200 MG/ML injection every 14 (fourteen) days. 05/22/17   [provider]  vitamin B-12 (CYANOCOBALAMIN) 1000 MCG tablet Take 1,000 mcg by mouth daily.    [provider]  ZETIA 10 MG tablet Take 10 mg by mouth daily. 09/22/23   [provider]    Family History Family History  Problem Relation Age of Onset   Stroke Mother    Heart Problems Father    Heart murmur Sister    Heart Problems Sister     Social History Social History[1]   Allergies   Patient has no known allergies.   Review of Systems Review of Systems   Physical Exam Triage Vital Signs ED Triage Vitals  Encounter Vitals Group     BP 09/26/24 1330 132/72     Girls Systolic BP Percentile --      Girls Diastolic BP Percentile --      Boys Systolic BP Percentile --      Boys Diastolic BP Percentile --      Pulse Rate 09/26/24 1330 (!)  58     Resp 09/26/24 1330 20     Temp 09/26/24 1330 98.4 F (36.9 C)     Temp Source 09/26/24 1330 Oral     SpO2 09/26/24 1330 96 %     Weight --      Height --      Head Circumference --      Peak Flow --      Pain Score 09/26/24 1332 4     Pain Loc --      Pain Education --      Exclude from Growth Chart --    No data found.  Updated Vital Signs BP 132/72 (BP Location: Right Arm)   Pulse (!) 58   Temp 98.4 F (36.9 C) (Oral)   Resp 20   SpO2 96%   Visual Acuity Right Eye Distance:   Left Eye Distance:   Bilateral Distance:    Right Eye Near:   Left Eye Near:    Bilateral Near:     Physical Exam   UC Treatments / Results  Labs (all labs ordered are listed, but only abnormal results are displayed) Labs Reviewed -  No data to display  EKG   Radiology No results found.  Procedures Procedures (including critical care time)  Medications Ordered in UC Medications - No data to display  Initial Impression / Assessment and Plan / UC Course  I have reviewed the triage vital signs and the nursing notes.  Pertinent labs & imaging results that were available during my care of the patient were reviewed by me and considered in my medical decision making (see chart for details).     *** Final Clinical Impressions(s) / UC Diagnoses   Final diagnoses:  None   Discharge Instructions   None    ED Prescriptions   None    PDMP not reviewed this encounter.    [1]  Social History Tobacco Use   Smoking status: Former    Current packs/day: 0.00    Average packs/day: 1 pack/day for 40.0 years (40.0 ttl pk-yrs)    Types: Cigarettes    Start date: 10/05/1971    Quit date: 10/05/2011    Years since quitting: 12.9   Smokeless tobacco: Never  Vaping Use   Vaping status: Former   Quit date: 10/05/2011  Substance Use Topics   Alcohol use: Yes    Alcohol/week: 21.0 standard drinks of alcohol    Types: 21 Shots of liquor per week    Comment: VODKA   Drug use: No   "

## 2024-09-26 NOTE — Discharge Instructions (Addendum)
 The radiologist mention possible acute injury to the ankle area based on your x-ray.  It was recommended to place you in an immobilizer and have you wear this for the next couple weeks.  In the meantime would like for you to get in with orthopedic for second opinion. Try to wear the boot at all times but when you are home you can elevate the leg and take the boot off. Try doing ice to the area.

## 2024-09-26 NOTE — ED Triage Notes (Signed)
 Right ankle pain x 1 month after rolling it while in the yard. Patient states it was swollen for about a week and didn't seem all that painful until last 10 days when it is now swollen all the time.Has been wearing ankle brace and compression socks for comfort and stability.

## 2024-10-27 NOTE — Progress Notes (Signed)
 [facility]                                          MRN: [id number]   10/27/2024   The [facility] Quality Team Specialist reviewed this patient medical record for the purposes of chart review for care gap closure. The following were reviewed: chart review for care gap closure-controlling blood pressure.    [facility] Quality Team
